# Patient Record
Sex: Female | Born: 1980 | Race: Black or African American | Hispanic: No | Marital: Single | Smoking: Current every day smoker
Health system: Southern US, Community
[De-identification: ages and names within clinical notes are randomized; demographics above are authoritative.]

## PROBLEM LIST (undated history)

## (undated) DIAGNOSIS — D332 Benign neoplasm of brain, unspecified: Secondary | ICD-10-CM

## (undated) HISTORY — PX: VENTRICULOPERITONEAL SHUNT: SHX204

---

## 2004-06-06 ENCOUNTER — Emergency Department (HOSPITAL_COMMUNITY): Admission: EM | Admit: 2004-06-06 | Discharge: 2004-06-06 | Payer: Self-pay | Admitting: *Deleted

## 2005-03-04 ENCOUNTER — Emergency Department (HOSPITAL_COMMUNITY): Admission: EM | Admit: 2005-03-04 | Discharge: 2005-03-04 | Payer: Self-pay | Admitting: Emergency Medicine

## 2005-03-20 ENCOUNTER — Emergency Department (HOSPITAL_COMMUNITY): Admission: EM | Admit: 2005-03-20 | Discharge: 2005-03-20 | Payer: Self-pay | Admitting: Emergency Medicine

## 2005-05-01 ENCOUNTER — Emergency Department (HOSPITAL_COMMUNITY): Admission: EM | Admit: 2005-05-01 | Discharge: 2005-05-01 | Payer: Self-pay | Admitting: *Deleted

## 2005-05-13 ENCOUNTER — Emergency Department (HOSPITAL_COMMUNITY): Admission: EM | Admit: 2005-05-13 | Discharge: 2005-05-13 | Payer: Self-pay | Admitting: Emergency Medicine

## 2005-05-30 ENCOUNTER — Emergency Department (HOSPITAL_COMMUNITY): Admission: EM | Admit: 2005-05-30 | Discharge: 2005-05-30 | Payer: Self-pay | Admitting: Family Medicine

## 2005-06-14 ENCOUNTER — Emergency Department (HOSPITAL_COMMUNITY): Admission: EM | Admit: 2005-06-14 | Discharge: 2005-06-15 | Payer: Self-pay | Admitting: Emergency Medicine

## 2005-06-16 ENCOUNTER — Emergency Department (HOSPITAL_COMMUNITY): Admission: EM | Admit: 2005-06-16 | Discharge: 2005-06-16 | Payer: Self-pay | Admitting: Emergency Medicine

## 2005-08-25 ENCOUNTER — Emergency Department (HOSPITAL_COMMUNITY): Admission: EM | Admit: 2005-08-25 | Discharge: 2005-08-25 | Payer: Self-pay | Admitting: Family Medicine

## 2005-08-27 ENCOUNTER — Emergency Department (HOSPITAL_COMMUNITY): Admission: EM | Admit: 2005-08-27 | Discharge: 2005-08-27 | Payer: Self-pay | Admitting: Emergency Medicine

## 2005-11-09 ENCOUNTER — Emergency Department (HOSPITAL_COMMUNITY): Admission: EM | Admit: 2005-11-09 | Discharge: 2005-11-09 | Payer: Self-pay | Admitting: Emergency Medicine

## 2005-11-20 ENCOUNTER — Emergency Department (HOSPITAL_COMMUNITY): Admission: EM | Admit: 2005-11-20 | Discharge: 2005-11-20 | Payer: Self-pay | Admitting: Family Medicine

## 2005-11-22 ENCOUNTER — Emergency Department (HOSPITAL_COMMUNITY): Admission: EM | Admit: 2005-11-22 | Discharge: 2005-11-23 | Payer: Self-pay | Admitting: Emergency Medicine

## 2005-12-08 ENCOUNTER — Emergency Department (HOSPITAL_COMMUNITY): Admission: EM | Admit: 2005-12-08 | Discharge: 2005-12-08 | Payer: Self-pay | Admitting: Emergency Medicine

## 2006-01-03 ENCOUNTER — Emergency Department (HOSPITAL_COMMUNITY): Admission: EM | Admit: 2006-01-03 | Discharge: 2006-01-03 | Payer: Self-pay | Admitting: Family Medicine

## 2006-01-06 ENCOUNTER — Emergency Department (HOSPITAL_COMMUNITY): Admission: EM | Admit: 2006-01-06 | Discharge: 2006-01-06 | Payer: Self-pay | Admitting: Family Medicine

## 2006-01-10 ENCOUNTER — Emergency Department (HOSPITAL_COMMUNITY): Admission: EM | Admit: 2006-01-10 | Discharge: 2006-01-11 | Payer: Self-pay | Admitting: Emergency Medicine

## 2006-01-20 ENCOUNTER — Emergency Department (HOSPITAL_COMMUNITY): Admission: EM | Admit: 2006-01-20 | Discharge: 2006-01-20 | Payer: Self-pay | Admitting: Emergency Medicine

## 2006-06-03 ENCOUNTER — Emergency Department (HOSPITAL_COMMUNITY): Admission: EM | Admit: 2006-06-03 | Discharge: 2006-06-03 | Payer: Self-pay | Admitting: Emergency Medicine

## 2006-06-27 ENCOUNTER — Emergency Department (HOSPITAL_COMMUNITY): Admission: AD | Admit: 2006-06-27 | Discharge: 2006-06-28 | Payer: Self-pay | Admitting: Family Medicine

## 2006-07-27 ENCOUNTER — Emergency Department (HOSPITAL_COMMUNITY): Admission: EM | Admit: 2006-07-27 | Discharge: 2006-07-27 | Payer: Self-pay | Admitting: Family Medicine

## 2006-12-28 ENCOUNTER — Emergency Department (HOSPITAL_COMMUNITY): Admission: EM | Admit: 2006-12-28 | Discharge: 2006-12-29 | Payer: Self-pay | Admitting: Emergency Medicine

## 2006-12-30 ENCOUNTER — Emergency Department (HOSPITAL_COMMUNITY): Admission: EM | Admit: 2006-12-30 | Discharge: 2006-12-30 | Payer: Self-pay | Admitting: Emergency Medicine

## 2007-02-02 ENCOUNTER — Emergency Department (HOSPITAL_COMMUNITY): Admission: EM | Admit: 2007-02-02 | Discharge: 2007-02-02 | Payer: Self-pay | Admitting: Emergency Medicine

## 2008-07-20 ENCOUNTER — Emergency Department (HOSPITAL_COMMUNITY): Admission: EM | Admit: 2008-07-20 | Discharge: 2008-07-20 | Payer: Self-pay | Admitting: Family Medicine

## 2008-10-11 ENCOUNTER — Emergency Department (HOSPITAL_COMMUNITY): Admission: EM | Admit: 2008-10-11 | Discharge: 2008-10-11 | Payer: Self-pay | Admitting: Emergency Medicine

## 2008-10-30 ENCOUNTER — Emergency Department (HOSPITAL_COMMUNITY): Admission: EM | Admit: 2008-10-30 | Discharge: 2008-10-30 | Payer: Self-pay | Admitting: Family Medicine

## 2008-12-27 ENCOUNTER — Ambulatory Visit (HOSPITAL_COMMUNITY): Admission: RE | Admit: 2008-12-27 | Discharge: 2008-12-27 | Payer: Self-pay | Admitting: Obstetrics and Gynecology

## 2009-04-07 IMAGING — CT CT NECK W/ CM
2 of 3 series · 13 of 20 positions shown, 16 images · IV contrast (omnipaque)
Comparison: 06/27/2006

CLINICAL DATA: Tonsillar abscess

NECK CT WITH CONTRAST
TECHNIQUE: Multidetector CT imaging of the neck was performed following the
standard protocol during administration of intravenous contrast.
Contrast:  100 cc Omnipaque 300

[Series 2: 2cc/(id)/1cc/(id) · axial · 0.48mm/px · z∈[-364,-158]mm · 12 of 66 slices shown, 15 images]
[im 6/66  soft-tissue]
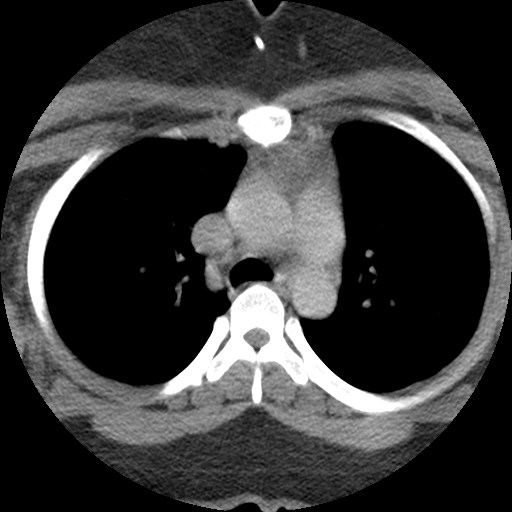
[im 6/66  bone]
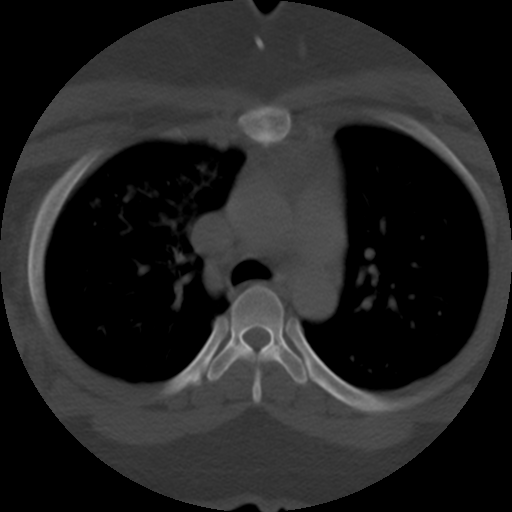
[im 11/66  bone]
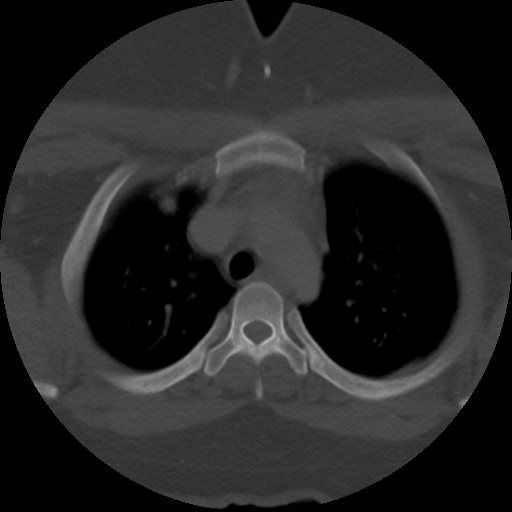
[im 16/66  bone]
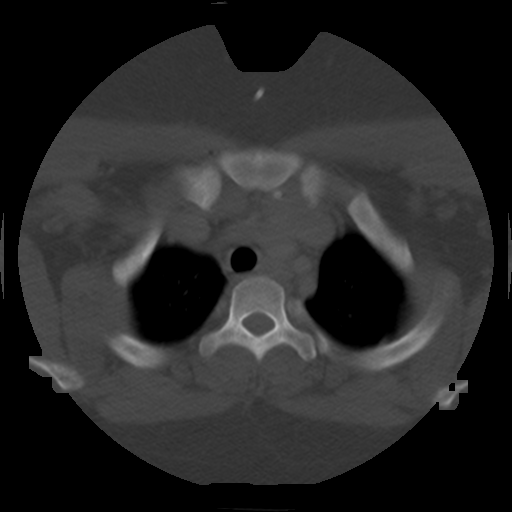
[im 21/66  bone]
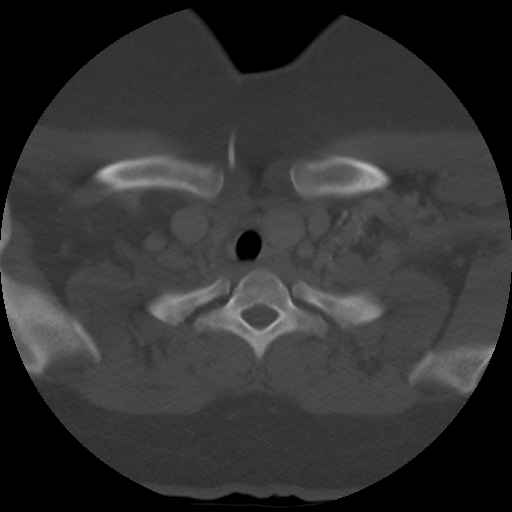
[im 26/66  soft-tissue]
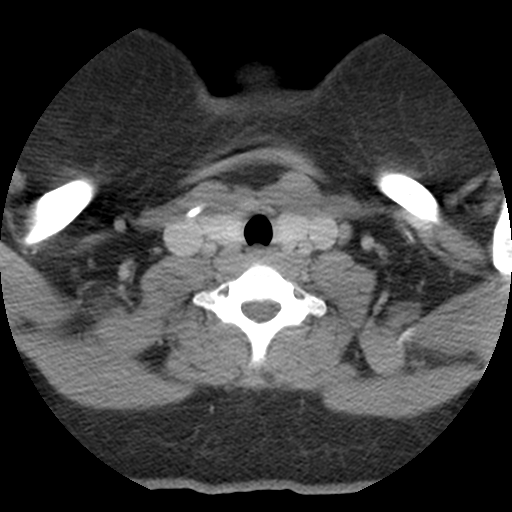
[im 26/66  bone]
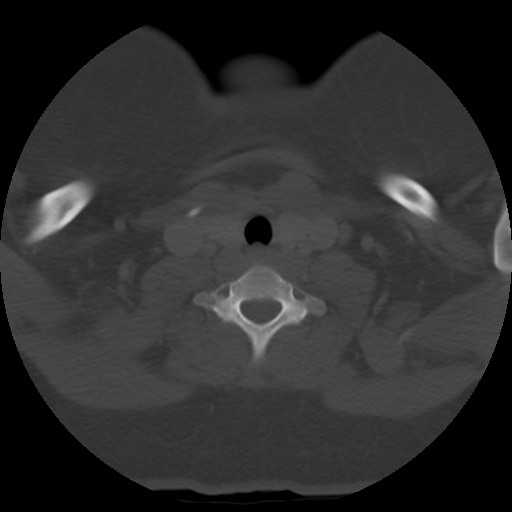
[im 31/66  bone]
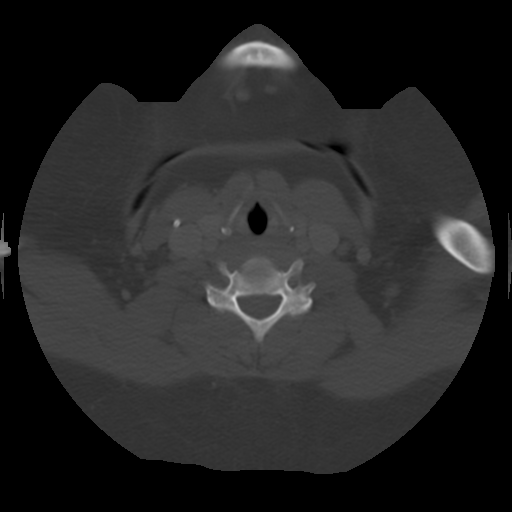
[im 36/66  bone]
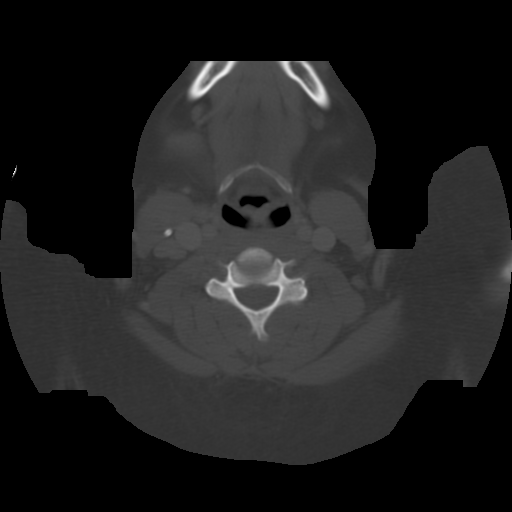
[im 41/66  bone]
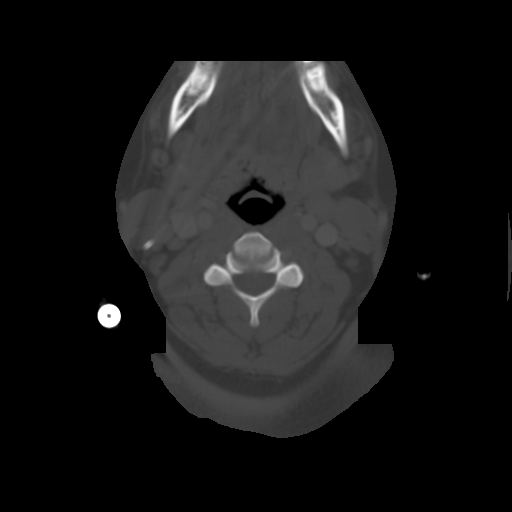
[im 46/66  soft-tissue]
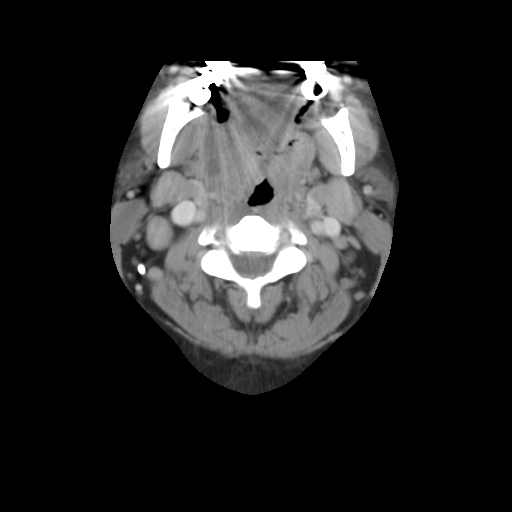
[im 46/66  bone]
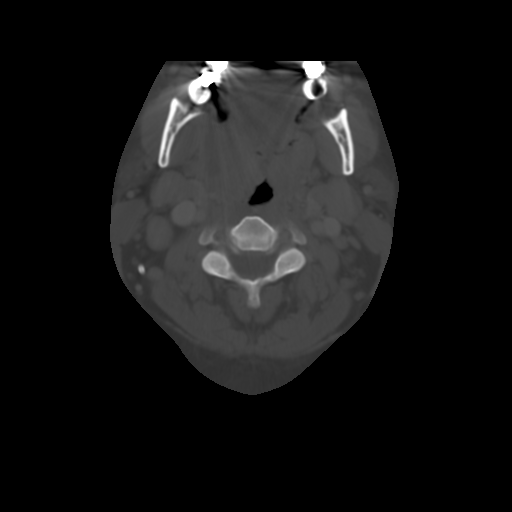
[im 51/66  bone]
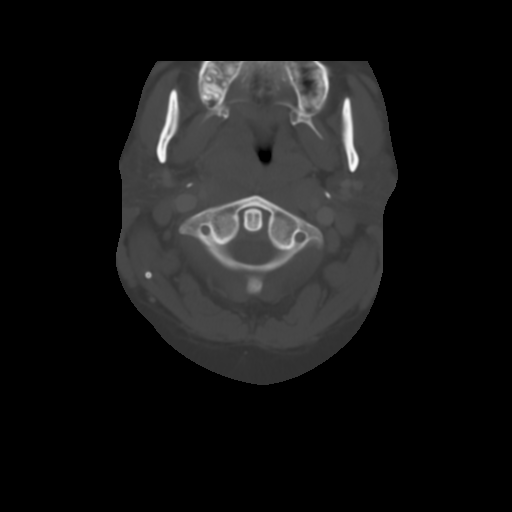
[im 56/66  bone]
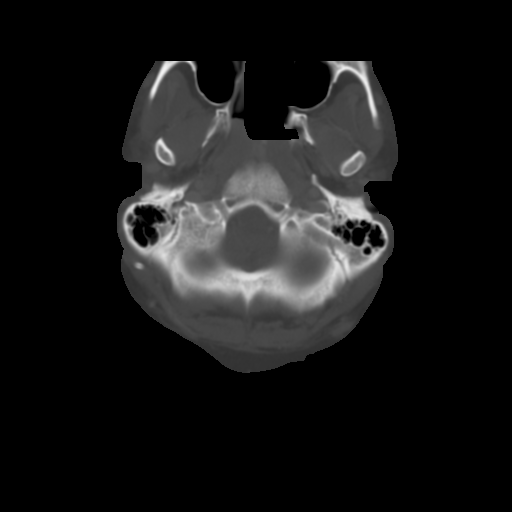
[im 61/66  bone]
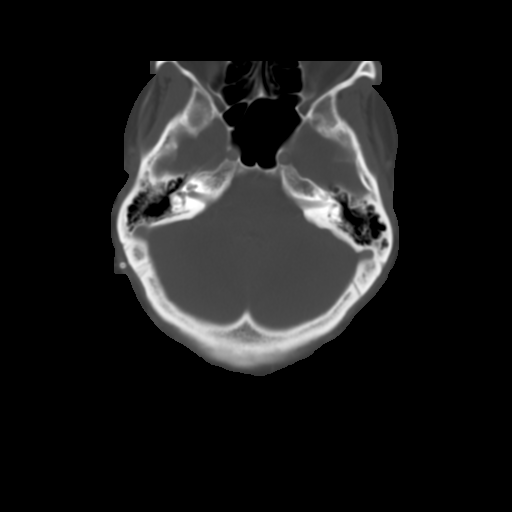

[Series 300: reformatted · sagittal · 0.49mm/px · 1 of 93 slices shown]
[im 47/93  bone]
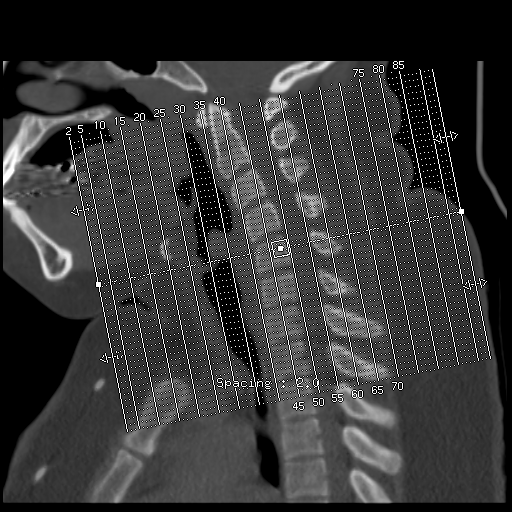

[13 of 20 positions shown; findings below may reference images not displayed]

FINDINGS: Patient has a large rim-enhancing fluid collection in the right
tonsillar region measuring 1.7 x 2.3 cm, image 19. Finding is consistent with
peritonsillar abscess. 

Multiple reactive type lymph nodes are identified. For example right
jugulodigastric lymph node measures 2.0 x 1.6cm, image 23. 

Right submandibular lymph node measures 1.8 x 1.4 cm, image 29.

No additional abnormal fluid collections are identified.

Within the right upper lobe there is air space disease which is concerning for
pneumonia.

Within the right maxillary sinus there is a retention cyst unchanged from prior
exam. 

The remaining visualized paranasal sinuses and mastoid air cells are normally
aerated. 

Review of the bone window shows no lytic or sclerotic lesions.

IMPRESSION

Large right peritonsillar abscess as above with associated lymphadenopathy in
the neck. 

Right upper lobe pneumonia

## 2009-04-20 ENCOUNTER — Inpatient Hospital Stay (HOSPITAL_COMMUNITY): Admission: AD | Admit: 2009-04-20 | Discharge: 2009-04-23 | Payer: Self-pay | Admitting: Obstetrics and Gynecology

## 2009-06-10 ENCOUNTER — Emergency Department (HOSPITAL_COMMUNITY): Admission: EM | Admit: 2009-06-10 | Discharge: 2009-06-10 | Payer: Self-pay | Admitting: Family Medicine

## 2010-03-02 ENCOUNTER — Emergency Department (HOSPITAL_COMMUNITY): Admission: EM | Admit: 2010-03-02 | Discharge: 2010-03-02 | Payer: Self-pay | Admitting: Family Medicine

## 2010-10-24 LAB — CBC
HCT: 29.5 % — ABNORMAL LOW (ref 36.0–46.0)
HCT: 33.7 % — ABNORMAL LOW (ref 36.0–46.0)
Hemoglobin: 11.1 g/dL — ABNORMAL LOW (ref 12.0–15.0)
Hemoglobin: 9.9 g/dL — ABNORMAL LOW (ref 12.0–15.0)
MCHC: 32.9 g/dL (ref 30.0–36.0)
MCHC: 33.6 g/dL (ref 30.0–36.0)
MCV: 88 fL (ref 78.0–100.0)
MCV: 88.9 fL (ref 78.0–100.0)
Platelets: 266 10*3/uL (ref 150–400)
Platelets: 324 10*3/uL (ref 150–400)
RBC: 3.33 MIL/uL — ABNORMAL LOW (ref 3.87–5.11)
RBC: 3.83 MIL/uL — ABNORMAL LOW (ref 3.87–5.11)
RDW: 13.9 % (ref 11.5–15.5)
RDW: 14.2 % (ref 11.5–15.5)
WBC: 10.5 10*3/uL (ref 4.0–10.5)
WBC: 11.2 10*3/uL — ABNORMAL HIGH (ref 4.0–10.5)

## 2010-10-24 LAB — RPR: RPR Ser Ql: NONREACTIVE

## 2010-10-31 LAB — URINALYSIS, ROUTINE W REFLEX MICROSCOPIC
Bilirubin Urine: NEGATIVE
Glucose, UA: NEGATIVE mg/dL
Hgb urine dipstick: NEGATIVE
Ketones, ur: NEGATIVE mg/dL
Nitrite: NEGATIVE
Protein, ur: NEGATIVE mg/dL
Specific Gravity, Urine: 1.025 (ref 1.005–1.030)
Urobilinogen, UA: 0.2 mg/dL (ref 0.0–1.0)
pH: 6.5 (ref 5.0–8.0)

## 2010-10-31 LAB — DIFFERENTIAL
Basophils Absolute: 0 10*3/uL (ref 0.0–0.1)
Basophils Relative: 0 % (ref 0–1)
Eosinophils Absolute: 0.1 10*3/uL (ref 0.0–0.7)
Eosinophils Relative: 1 % (ref 0–5)
Lymphocytes Relative: 17 % (ref 12–46)
Lymphs Abs: 1.4 10*3/uL (ref 0.7–4.0)
Monocytes Absolute: 0.4 10*3/uL (ref 0.1–1.0)
Monocytes Relative: 5 % (ref 3–12)
Neutro Abs: 6.4 10*3/uL (ref 1.7–7.7)
Neutrophils Relative %: 77 % (ref 43–77)

## 2010-10-31 LAB — LIPASE, BLOOD: Lipase: 16 U/L (ref 11–59)

## 2010-10-31 LAB — COMPREHENSIVE METABOLIC PANEL
ALT: 18 U/L (ref 0–35)
AST: 16 U/L (ref 0–37)
Albumin: 3.2 g/dL — ABNORMAL LOW (ref 3.5–5.2)
Alkaline Phosphatase: 65 U/L (ref 39–117)
BUN: 4 mg/dL — ABNORMAL LOW (ref 6–23)
CO2: 23 mEq/L (ref 19–32)
Calcium: 9 mg/dL (ref 8.4–10.5)
Chloride: 105 mEq/L (ref 96–112)
Creatinine, Ser: 0.44 mg/dL (ref 0.4–1.2)
GFR calc Af Amer: >60 mL/min (ref >60)
GFR calc non Af Amer: >60 mL/min (ref >60)
Glucose, Bld: 86 mg/dL (ref 70–99)
Potassium: 3.4 mEq/L — ABNORMAL LOW (ref 3.5–5.1)
Sodium: 135 mEq/L (ref 135–145)
Total Bilirubin: 0.5 mg/dL (ref 0.3–1.2)
Total Protein: 6.6 g/dL (ref 6.0–8.3)

## 2010-10-31 LAB — CBC
HCT: 35.5 % — ABNORMAL LOW (ref 36.0–46.0)
Hemoglobin: 12 g/dL (ref 12.0–15.0)
MCHC: 33.7 g/dL (ref 30.0–36.0)
MCV: 87.9 fL (ref 78.0–100.0)
Platelets: 259 10*3/uL (ref 150–400)
RBC: 4.04 MIL/uL (ref 3.87–5.11)
RDW: 13.6 % (ref 11.5–15.5)
WBC: 8.3 10*3/uL (ref 4.0–10.5)

## 2010-10-31 LAB — POCT PREGNANCY, URINE: Preg Test, Ur: POSITIVE

## 2011-04-25 LAB — POCT URINALYSIS DIP (DEVICE)
Bilirubin Urine: NEGATIVE
Glucose, UA: NEGATIVE mg/dL
Hgb urine dipstick: NEGATIVE
Ketones, ur: 15 mg/dL — AB
Nitrite: NEGATIVE
Protein, ur: NEGATIVE mg/dL
Specific Gravity, Urine: 1.02 (ref 1.005–1.030)
Urobilinogen, UA: 0.2 mg/dL (ref 0.0–1.0)
pH: 6 (ref 5.0–8.0)

## 2011-04-25 LAB — POCT PREGNANCY, URINE: Preg Test, Ur: NEGATIVE

## 2011-05-05 LAB — POCT PREGNANCY, URINE
Operator id: 282151
Preg Test, Ur: NEGATIVE

## 2011-05-08 LAB — CBC
HCT: 38.3
Hemoglobin: 12.7
MCHC: 33.1
MCV: 84
Platelets: 320
RBC: 4.56
RDW: 13.4
WBC: 12 — ABNORMAL HIGH

## 2011-05-08 LAB — BASIC METABOLIC PANEL
BUN: 7
CO2: 24
Calcium: 8.8
Chloride: 103
Creatinine, Ser: 0.67
GFR calc Af Amer: >60
GFR calc non Af Amer: >60
Glucose, Bld: 94
Potassium: 3.8
Sodium: 135

## 2011-05-08 LAB — DIFFERENTIAL
Basophils Absolute: 0
Basophils Relative: 0
Eosinophils Absolute: 0.1
Eosinophils Relative: 1
Lymphocytes Relative: 19
Lymphs Abs: 2.3
Monocytes Absolute: 1 — ABNORMAL HIGH
Monocytes Relative: 9
Neutro Abs: 8.6 — ABNORMAL HIGH
Neutrophils Relative %: 71

## 2011-05-08 LAB — RAPID STREP SCREEN (MED CTR MEBANE ONLY): Streptococcus, Group A Screen (Direct): POSITIVE — AB

## 2011-09-22 ENCOUNTER — Encounter (HOSPITAL_COMMUNITY): Payer: Self-pay | Admitting: *Deleted

## 2011-09-22 ENCOUNTER — Emergency Department (HOSPITAL_COMMUNITY)
Admission: EM | Admit: 2011-09-22 | Discharge: 2011-09-22 | Disposition: A | Discharge status: Home / Self Care | Payer: Medicaid Other | Attending: Emergency Medicine | Admitting: Emergency Medicine

## 2011-09-22 ENCOUNTER — Other Ambulatory Visit: Payer: Self-pay

## 2011-09-22 ENCOUNTER — Emergency Department (HOSPITAL_COMMUNITY): Payer: Medicaid Other

## 2011-09-22 DIAGNOSIS — R0789 Other chest pain: Secondary | ICD-10-CM

## 2011-09-22 DIAGNOSIS — R071 Chest pain on breathing: Secondary | ICD-10-CM | POA: Insufficient documentation

## 2011-09-22 DIAGNOSIS — R079 Chest pain, unspecified: Secondary | ICD-10-CM | POA: Insufficient documentation

## 2011-09-22 DIAGNOSIS — F17200 Nicotine dependence, unspecified, uncomplicated: Secondary | ICD-10-CM | POA: Insufficient documentation

## 2011-09-22 HISTORY — DX: Benign neoplasm of brain, unspecified: D33.2

## 2011-09-22 LAB — BASIC METABOLIC PANEL
BUN: 7 mg/dL (ref 6–23)
Calcium: 9.2 mg/dL (ref 8.4–10.5)
Chloride: 105 mEq/L (ref 96–112)
Creatinine, Ser: 0.53 mg/dL (ref 0.50–1.10)
GFR calc Af Amer: >90 mL/min (ref >90)
GFR calc non Af Amer: >90 mL/min (ref >90)

## 2011-09-22 LAB — CBC
HCT: 40.2 % (ref 36.0–46.0)
MCH: 28.3 pg (ref 26.0–34.0)
MCHC: 32.8 g/dL (ref 30.0–36.0)
MCV: 86.3 fL (ref 78.0–100.0)
Platelets: 273 10*3/uL (ref 150–400)
RDW: 13.6 % (ref 11.5–15.5)

## 2011-09-22 MED ORDER — ONDANSETRON HCL 4 MG/2ML IJ SOLN
4.0000 mg | Freq: Once | INTRAMUSCULAR | Status: AC
  Administered 2011-09-22: 4 mg via INTRAVENOUS
  Filled 2011-09-22: qty 2

## 2011-09-22 MED ORDER — ASPIRIN 81 MG PO CHEW
324.0000 mg | CHEWABLE_TABLET | Freq: Once | ORAL | Status: AC
  Administered 2011-09-22: 324 mg via ORAL
  Filled 2011-09-22: qty 4

## 2011-09-22 MED ORDER — HYDROCODONE-ACETAMINOPHEN 5-325 MG PO TABS: 1.0000 | ORAL_TABLET | ORAL | Status: AC | PRN

## 2011-09-22 MED ORDER — OXYCODONE-ACETAMINOPHEN 5-325 MG PO TABS
2.0000 | ORAL_TABLET | Freq: Once | ORAL | Status: AC
  Administered 2011-09-22: 2 via ORAL
  Filled 2011-09-22: qty 2

## 2011-09-22 MED ORDER — MORPHINE SULFATE 4 MG/ML IJ SOLN
4.0000 mg | Freq: Once | INTRAMUSCULAR | Status: AC
  Administered 2011-09-22: 4 mg via INTRAVENOUS
  Filled 2011-09-22: qty 1

## 2011-09-22 NOTE — Discharge Instructions (Signed)
Chest Wall Pain Chest wall pain is pain in or around the bones and muscles of your chest. It may take up to 6 weeks to get better. It may take longer if you must stay physically active in your work and activities.  CAUSES  Chest wall pain may happen on its own. However, it may be caused by:  A viral illness like the flu.   Injury.   Coughing.   Exercise.   Arthritis.   Fibromyalgia.   Shingles.  HOME CARE INSTRUCTIONS   Avoid overtiring physical activity. Try not to strain or perform activities that cause pain. This includes any activities using your chest or your abdominal and side muscles, especially if heavy weights are used.   Put ice on the sore area.   Put ice in a plastic bag.   Place a towel between your skin and the bag.   Leave the ice on for 15 to 20 minutes per hour while awake for the first 2 days.   Only take over-the-counter or prescription medicines for pain, discomfort, or fever as directed by your caregiver.  SEEK IMMEDIATE MEDICAL CARE IF:   Your pain increases, or you are very uncomfortable.   You have a fever.   Your chest pain becomes worse.   You have new, unexplained symptoms.   You have nausea or vomiting.   You feel sweaty or lightheaded.   You have a cough with phlegm (sputum), or you cough up blood.  MAKE SURE YOU:   Understand these instructions.   Will watch your condition.   Will get help right away if you are not doing well or get worse.  Document Released: 07/07/2005 Document Revised: 06/26/2011 Document Reviewed: 03/03/2011 [employer] Patient Information 2012 [employer]. 

## 2011-09-22 NOTE — ED Provider Notes (Signed)
Medical screening examination/treatment/procedure(s) were conducted as a shared visit with non-physician practitioner(s) and myself.  I personally evaluated the patient during the encounter  Chanice Brenton, MD 09/22/11 1735 

## 2011-09-22 NOTE — ED Notes (Signed)
Patient with complaints of mid chest pain for 3 days,  She denies any sob,  Denies n/v. Denies cough.  Patient states taking a deep breath increases her pain.  She is on birth control

## 2011-09-22 NOTE — ED Notes (Signed)
Transported to xray 

## 2011-09-22 NOTE — ED Provider Notes (Signed)
History     CSN: [id number]  Arrival date & time 09/22/11  4098   First MD Initiated Contact with Patient 09/22/11 0957      Chief Complaint  Patient presents with  . Chest Pain    (Consider location/radiation/quality/duration/timing/severity/associated sxs/prior treatment) HPI  Presents to the emergency department with complaints of chest pain that started 3 days ago. The pain starts in her left shoulder and radiates down across her chest. The patient denies having any shortness of breath, nausea, vomiting, difficulty breathing. \Patient has a Mirena IUD. Patient denies family history of cardiac disease. Patient has not traveled recently. Patient does not have any lower extremity swelling, history of leg swelling or clots in legs or chest. The patient is a smoker. The patient states that when she moves a certain way her pain is elicited and then calms down. Denies pain worsening with palpation.  Past Medical History  Diagnosis Date  . Benign cerebral tumor     Past Surgical History  Procedure Date  . Ventriculoperitoneal shunt     No family history on file.  History  Substance Use Topics  . Smoking status: Current Everyday Smoker  . Smokeless tobacco: Not on file  . Alcohol Use: Yes    OB History    Grav Para Term Preterm Abortions TAB SAB Ect Mult Living                  Review of Systems  All other systems reviewed and are negative.    Allergies  Review of patient's allergies indicates no known allergies.  Home Medications   Current Outpatient Rx  Name Route Sig Dispense Refill  . IBUPROFEN 200 MG PO TABS Oral Take 400 mg by mouth every 6 (six) hours as needed. For pain    . HYDROCODONE-ACETAMINOPHEN 5-325 MG PO TABS Oral Take 1 tablet by mouth every 4 (four) hours as needed for pain. 8 tablet 0    BP 112/73  Pulse 96  Temp(Src) 97.6 F (36.4 C) (Oral)  Resp 23  Ht 5\' 5"  (1.651 m)  Wt 274 lb (124.286 kg)  BMI 45.60 kg/m2  SpO2 100%  LMP  09/02/2011  Physical Exam  Nursing note and vitals reviewed. Constitutional: She appears well-developed and well-nourished. No distress.  HENT:  Head: Normocephalic and atraumatic.  Eyes: Pupils are equal, round, and reactive to light.  Neck: Normal range of motion. Neck supple.  Cardiovascular: Normal rate and regular rhythm.   Pulmonary/Chest: Effort normal.  Abdominal: Soft.  Musculoskeletal:       Left shoulder: She exhibits pain and spasm. She exhibits normal range of motion, no tenderness, no bony tenderness, no swelling, no effusion, no crepitus, no deformity, no laceration and normal pulse.  Neurological: She is alert.  Skin: Skin is warm and dry.    ED Course  Procedures (including critical care time)  Labs Reviewed  BASIC METABOLIC PANEL - Abnormal; Notable for the following:    Glucose, Bld 100 (*)    All other components within normal limits  CBC  TROPONIN I   Dg Chest 2 View  09/22/2011  *RADIOLOGY REPORT*  Clinical Data: Chest pain.  CHEST - 2 VIEW  Comparison: 06/03/2006.  Findings: Stable normal sized heart and clear lungs with normal vascularity.  Stable right ventricular peritoneal shunt catheter. Unremarkable bones.  IMPRESSION: No acute abnormality.  Original Report Authenticated By: Darrol Angel, M.D.     1. Chest wall pain       MDM  PT is PERC negative and has no risk factors on the WELLS criteria.  Pt very low risk for Pulmonary.   Date: 09/22/2011  Rate: 85  Rhythm: normal sinus rhythm  QRS Axis: normal  Intervals: normal  ST/T Wave abnormalities: normal  Conduction Disutrbances:none  Narrative Interpretation:   Old EKG Reviewed: unchanged from Jun 03, 2006  Pt evaluated by Dr. Ethelda Chick and myself - her smoking and diet/sugars were discussed with her and she ahs been informed on the dangers. Her cardiac work-up is negative. Pt given pain mediations for muscular pain. Pt also given a referral to Dr. Ranell Patrick with Ortho for follow-up if pain  persists.  Dorthula Matas, PA 09/22/11 1302

## 2011-09-22 NOTE — ED Provider Notes (Signed)
Complaint of anterior chest pain left sided onset 3 days ago constant worse with moving or twisting her torso no shortness of breath no nausea no sweatiness pain is nonexertional. Birth control consists of IUD. On exam alert nontoxic lungs clear to auscultation heart regular rate and rhythm chest is exquisitely tender left-sided anterior reproducing pain exactly. Medical decision making doubt cardiac etiology with highly atypical symptoms near normal ECG. Only risk factor smoking. Doubt pulmonary embolism denies shortness of breath atypical symptoms. Symptoms most consistent with musculoskeletal chest pain  Doug Sou, MD 09/22/11 1243

## 2012-03-13 ENCOUNTER — Emergency Department (HOSPITAL_COMMUNITY)
Admission: EM | Admit: 2012-03-13 | Discharge: 2012-03-13 | Disposition: A | Discharge status: Home / Self Care | Payer: Self-pay | Attending: Emergency Medicine | Admitting: Emergency Medicine

## 2012-03-13 ENCOUNTER — Encounter (HOSPITAL_COMMUNITY): Payer: Self-pay | Admitting: Family Medicine

## 2012-03-13 DIAGNOSIS — F17200 Nicotine dependence, unspecified, uncomplicated: Secondary | ICD-10-CM | POA: Insufficient documentation

## 2012-03-13 DIAGNOSIS — N63 Unspecified lump in unspecified breast: Secondary | ICD-10-CM | POA: Insufficient documentation

## 2012-03-13 DIAGNOSIS — N644 Mastodynia: Secondary | ICD-10-CM | POA: Insufficient documentation

## 2012-03-13 MED ORDER — IBUPROFEN 400 MG PO TABS
800.0000 mg | ORAL_TABLET | Freq: Once | ORAL | Status: AC
Start: 2012-03-13 — End: 2012-03-13
  Administered 2012-03-13: 800 mg via ORAL

## 2012-03-13 MED ORDER — IBUPROFEN 400 MG PO TABS
ORAL_TABLET | ORAL | Status: AC
  Filled 2012-03-13: qty 2

## 2012-03-13 NOTE — ED Notes (Signed)
Per pt sts right breast knot and pain since yesterday . denies drainage.

## 2012-03-13 NOTE — ED Provider Notes (Signed)
History  This chart was scribed for Richardean Canal, MD by Erskine Emery. This patient was seen in room TR07C/TR07C and the patient's care was started at 15:55.   CSN: [id number]  Arrival date & time 03/13/12  1428   None     Chief Complaint  Patient presents with  . Breast Pain    (Consider location/radiation/quality/duration/timing/severity/associated sxs/prior treatment) The history is provided by the patient. No language interpreter was used.  Tracy Harrington is a 31 y.o. female who presents to the Emergency Department complaining of a knot in the right breast, first noticed 2 days ago. Pt denies any associated vaginal discharge or fevers.  Her LNMP was the 18th of this month (about a week ago) and she denies any chance of pregnancy. Pt has never experienced similar symptoms and has never had a mammogram. Pt has a h/o a benign cerebral tumor but no other medical issues and no family history of breast cancer.   Past Medical History  Diagnosis Date  . Benign cerebral tumor     Past Surgical History  Procedure Date  . Ventriculoperitoneal shunt     History reviewed. No pertinent family history.  History  Substance Use Topics  . Smoking status: Current Everyday Smoker  . Smokeless tobacco: Not on file  . Alcohol Use: Yes    OB History    Grav Para Term Preterm Abortions TAB SAB Ect Mult Living                  Review of Systems  Constitutional: Negative for fever and chills.  Respiratory: Negative for shortness of breath.   Gastrointestinal: Negative for nausea and vomiting.  Genitourinary: Negative for vaginal discharge.  Musculoskeletal:       Right breast knot.  Neurological: Negative for weakness.    Allergies  Review of patient's allergies indicates no known allergies.  Home Medications   Current Outpatient Rx  Name Route Sig Dispense Refill  . IBUPROFEN 200 MG PO TABS Oral Take 400 mg by mouth every 6 (six) hours as needed. For pain      BP 115/64   Pulse 82  Temp 98.6 F (37 C) (Oral)  Resp 20  SpO2 98%  LMP 03/07/2012  Physical Exam  Nursing note and vitals reviewed. Constitutional: She is oriented to person, place, and time. She appears well-developed and well-nourished. No distress.  HENT:  Head: Normocephalic and atraumatic.  Eyes: EOM are normal.  Neck: Neck supple. No tracheal deviation present.  Cardiovascular: Normal rate.   Pulmonary/Chest: Effort normal. No respiratory distress. Right breast exhibits tenderness. Right breast exhibits no nipple discharge. Left breast exhibits no nipple discharge.       Right breast is lumpy with some tenderness around the nipple. Nipple is firm. No breast discharge bilaterally.  Musculoskeletal: Normal range of motion.  Neurological: She is alert and oriented to person, place, and time.  Skin: Skin is warm and dry.  Psychiatric: She has a normal mood and affect. Her behavior is normal.    ED Course  Procedures (including critical care time) DIAGNOSTIC STUDIES: Oxygen Saturation is 98% on room air, normal by my interpretation.    COORDINATION OF CARE: 15:55--I evaluated the patient and we discussed a treatment plan including breast examination to which the pt agreed.   16:00--I performed the breast examination with a chaperone present.    Labs Reviewed - No data to display No results found.   1. Breast pain  MDM  Tracy Harrington is a 31 y.o. female with tenderness L nipple. I counseled patient to get a mammogram to r/o underlying mass. She is to take motrin for pain in the mean time.    This document was completed by the scribe at my direction and I have reviewed its accuracy. I have personally examined the patient and agrees with the above document.   Chaney Malling, MD     Richardean Canal, MD 03/13/12 [id number]

## 2012-06-07 ENCOUNTER — Emergency Department (HOSPITAL_COMMUNITY)
Admission: EM | Admit: 2012-06-07 | Discharge: 2012-06-08 | Disposition: A | Discharge status: Home / Self Care | Payer: Self-pay | Attending: Emergency Medicine | Admitting: Emergency Medicine

## 2012-06-07 ENCOUNTER — Encounter (HOSPITAL_COMMUNITY): Payer: Self-pay | Admitting: *Deleted

## 2012-06-07 DIAGNOSIS — Z86011 Personal history of benign neoplasm of the brain: Secondary | ICD-10-CM | POA: Insufficient documentation

## 2012-06-07 DIAGNOSIS — M545 Low back pain, unspecified: Secondary | ICD-10-CM | POA: Insufficient documentation

## 2012-06-07 DIAGNOSIS — Z79899 Other long term (current) drug therapy: Secondary | ICD-10-CM | POA: Insufficient documentation

## 2012-06-07 DIAGNOSIS — F17200 Nicotine dependence, unspecified, uncomplicated: Secondary | ICD-10-CM | POA: Insufficient documentation

## 2012-06-07 NOTE — ED Provider Notes (Signed)
History   This chart was scribed for Tracy Racer, MD by Gerlean Ren, ED Scribe. This patient was seen in room TR05C/TR05C and the patient's care was started at 11:52 PM    CSN: [id number]  Arrival date & time 06/07/12  2259   First MD Initiated Contact with Patient 06/07/12 2328      Chief Complaint  Patient presents with  . Back Pain     The history is provided by the patient. No language interpreter was used.   Tracy Harrington is a 31 y.o. female who presents to the Emergency Department complaining of lower back pain that suddenly worsened today with no particular movement or position that is worsened by movement and has no improvement with tylenol.  Pt denies urinary symptoms, abdominal pain, and numbness or tingling in lower extremities.  Pt states she has h/o similar lower back pain but that current pain is more severe.  Pt is a current everyday smoker and reports alcohol use.   Past Medical History  Diagnosis Date  . Benign cerebral tumor     Past Surgical History  Procedure Date  . Ventriculoperitoneal shunt     History reviewed. No pertinent family history.  History  Substance Use Topics  . Smoking status: Current Every Day Smoker  . Smokeless tobacco: Not on file  . Alcohol Use: Yes    No OB history provided.   Review of Systems  Musculoskeletal: Positive for back pain. Negative for gait problem.  Neurological: Negative for numbness.    Allergies  Review of patient's allergies indicates no known allergies.  Home Medications   Current Outpatient Rx  Name  Route  Sig  Dispense  Refill  . ACETAMINOPHEN 500 MG PO TABS   Oral   Take 500 mg by mouth every 6 (six) hours as needed. pain         . ASPIRIN-ACETAMINOPHEN-CAFFEINE 250-250-65 MG PO TABS   Oral   Take 1 tablet by mouth every 6 (six) hours as needed. pain         . IBUPROFEN 600 MG PO TABS   Oral   Take 1 tablet (600 mg total) by mouth every 6 (six) hours as needed for pain.   30  tablet   0   . METHOCARBAMOL 500 MG PO TABS   Oral   Take 1 tablet (500 mg total) by mouth 2 (two) times daily.   20 tablet   0   . TRAMADOL HCL 50 MG PO TABS   Oral   Take 1 tablet (50 mg total) by mouth every 6 (six) hours as needed for pain.   15 tablet   0     BP 101/66  Pulse 75  Temp 98.7 F (37.1 C) (Oral)  Resp 16  SpO2 98%  Physical Exam  Nursing note and vitals reviewed. Constitutional: She is oriented to person, place, and time. She appears well-developed and well-nourished.       Pt is morbidly obese.  HENT:  Head: Normocephalic and atraumatic.  Eyes: Conjunctivae normal and EOM are normal. Pupils are equal, round, and reactive to light.  Neck: Normal range of motion. Neck supple.  Cardiovascular: Normal rate, regular rhythm and normal heart sounds.   Pulmonary/Chest: Effort normal and breath sounds normal.  Abdominal: Soft. Bowel sounds are normal.  Musculoskeletal: Normal range of motion.       Diffuse lumbar tenderness.  Ambulatory.    Neurological: She is alert and oriented to person, place, and  time.  Skin: Skin is warm and dry.  Psychiatric: She has a normal mood and affect.    ED Course  Procedures (including critical care time) DIAGNOSTIC STUDIES: Oxygen Saturation is 98% on room air, normal by my interpretation.    COORDINATION OF CARE: 11:56 PM- Patient informed of clinical course, understands medical decision-making process, and agrees with plan.  Ordered urinalysis and pregnancy test.      Labs Reviewed  URINALYSIS, ROUTINE W REFLEX MICROSCOPIC - Abnormal; Notable for the following:    APPearance HAZY (*)     Specific Gravity, Urine 1.043 (*)     All other components within normal limits  PREGNANCY, URINE   No results found.   1. Low back pain       MDM  I personally performed the services described in this documentation, which was scribed in my presence. The recorded information has been reviewed and is accurate.   No  red flags on exam or history. Will treat symptomatically and advise weight loss    Tracy Racer, MD 06/08/12 0040

## 2012-06-07 NOTE — ED Notes (Addendum)
C/o low back pain, onset 1400, 8/10, worse with movement and certain positions, also mild abd cramping and mild dizziness, (denies: radiation, nvd, fever, bleeding, urinary or vaginal sx), tried excedrin and tylenol PTA, no relief. "H/o similar back pain, but not this bad". Walks with limp, pain aggravated by movement & cough/sneeze.

## 2012-06-08 LAB — URINALYSIS, ROUTINE W REFLEX MICROSCOPIC
Bilirubin Urine: NEGATIVE
Hgb urine dipstick: NEGATIVE
Ketones, ur: NEGATIVE mg/dL
Specific Gravity, Urine: 1.043 — ABNORMAL HIGH (ref 1.005–1.030)
Urobilinogen, UA: 0.2 mg/dL (ref 0.0–1.0)

## 2012-06-08 MED ORDER — METHOCARBAMOL 500 MG PO TABS
1000.0000 mg | ORAL_TABLET | Freq: Once | ORAL | Status: AC
  Administered 2012-06-08: 1000 mg via ORAL
  Filled 2012-06-08: qty 2

## 2012-06-08 MED ORDER — METHOCARBAMOL 500 MG PO TABS: 500.0000 mg | ORAL_TABLET | Freq: Two times a day (BID) | ORAL | Status: DC

## 2012-06-08 MED ORDER — TRAMADOL HCL 50 MG PO TABS: 50.0000 mg | ORAL_TABLET | Freq: Four times a day (QID) | ORAL | Status: DC | PRN

## 2012-06-08 MED ORDER — TRAMADOL HCL 50 MG PO TABS
50.0000 mg | ORAL_TABLET | Freq: Once | ORAL | Status: AC
  Administered 2012-06-08: 50 mg via ORAL
  Filled 2012-06-08: qty 1

## 2012-06-08 MED ORDER — IBUPROFEN 600 MG PO TABS: 600.0000 mg | ORAL_TABLET | Freq: Four times a day (QID) | ORAL | Status: DC | PRN

## 2012-06-08 MED ORDER — KETOROLAC TROMETHAMINE 60 MG/2ML IM SOLN
60.0000 mg | Freq: Once | INTRAMUSCULAR | Status: AC
  Administered 2012-06-08: 60 mg via INTRAMUSCULAR
  Filled 2012-06-08: qty 2

## 2013-05-01 ENCOUNTER — Emergency Department (HOSPITAL_COMMUNITY)
Admission: EM | Admit: 2013-05-01 | Discharge: 2013-05-01 | Disposition: A | Discharge status: Home / Self Care | Payer: Medicaid Other | Attending: Emergency Medicine | Admitting: Emergency Medicine

## 2013-05-01 ENCOUNTER — Encounter (HOSPITAL_COMMUNITY): Payer: Self-pay | Admitting: Emergency Medicine

## 2013-05-01 ENCOUNTER — Emergency Department (HOSPITAL_COMMUNITY): Payer: Medicaid Other

## 2013-05-01 DIAGNOSIS — N6310 Unspecified lump in the right breast, unspecified quadrant: Secondary | ICD-10-CM

## 2013-05-01 DIAGNOSIS — Z8669 Personal history of other diseases of the nervous system and sense organs: Secondary | ICD-10-CM | POA: Insufficient documentation

## 2013-05-01 DIAGNOSIS — F17200 Nicotine dependence, unspecified, uncomplicated: Secondary | ICD-10-CM | POA: Insufficient documentation

## 2013-05-01 DIAGNOSIS — G8929 Other chronic pain: Secondary | ICD-10-CM | POA: Insufficient documentation

## 2013-05-01 DIAGNOSIS — N63 Unspecified lump in unspecified breast: Secondary | ICD-10-CM | POA: Insufficient documentation

## 2013-05-01 DIAGNOSIS — Z3202 Encounter for pregnancy test, result negative: Secondary | ICD-10-CM | POA: Insufficient documentation

## 2013-05-01 DIAGNOSIS — M545 Low back pain, unspecified: Secondary | ICD-10-CM | POA: Insufficient documentation

## 2013-05-01 DIAGNOSIS — R209 Unspecified disturbances of skin sensation: Secondary | ICD-10-CM | POA: Insufficient documentation

## 2013-05-01 LAB — PREGNANCY, URINE: Preg Test, Ur: NEGATIVE

## 2013-05-01 LAB — URINE MICROSCOPIC-ADD ON

## 2013-05-01 LAB — URINALYSIS, ROUTINE W REFLEX MICROSCOPIC
Glucose, UA: NEGATIVE mg/dL
Ketones, ur: NEGATIVE mg/dL
pH: 6.5 (ref 5.0–8.0)

## 2013-05-01 MED ORDER — CEPHALEXIN 500 MG PO CAPS: 500.0000 mg | ORAL_CAPSULE | Freq: Four times a day (QID) | ORAL | Status: AC

## 2013-05-01 MED ORDER — CYCLOBENZAPRINE HCL 5 MG PO TABS: 5.0000 mg | ORAL_TABLET | Freq: Three times a day (TID) | ORAL | Status: DC | PRN

## 2013-05-01 MED ORDER — NAPROXEN 500 MG PO TABS: 500.0000 mg | ORAL_TABLET | Freq: Two times a day (BID) | ORAL | Status: DC

## 2013-05-01 NOTE — ED Provider Notes (Signed)
CSN: [id number]     Arrival date & time 05/01/13  1523 History   First MD Initiated Contact with Patient 05/01/13 1540     Chief Complaint  Patient presents with  . Cyst  . Back Pain    HPI  Tracy Harrington is a 32 y.o. female with a PMH of benign cerebral tumor s/p VP shunt who presents to the ED for evaluation of cyst and back pain.  History was provided by the patient.    Patient states that she has had a cyst on her breast for the past week. She states that this cyst appears to be growing larger and is very painful to palpation. The cysts is located on the areola of her right nipple. She states that she had a similar cyst a few months ago and was prescribed some antibiotics in the cyst resolved. She states that this cyst is similar to her previous cyst and is in the same location. She states that she did brush her right breast up against a door however denies any wounds or trauma. She is not currently breast-feeding. She states that she's had some intermittent green and yellow discharge from her right nipple, however has had no continuous drainage. She is currently on her menstrual period. She does not currently have an OB/GYN or primary care provider. Patient states that she has "lumpy breasts" however has not had a mammogram in the past. She was told that she likely needed a mammogram however has not done so.  Patient also complains of some lower back pain for the past 2 years. Patient states that her pain began after delivery of her 77-year-old daughter. She denies any acute injuries or trauma. She states that her pain is been getting progressively worse over the past 2 months. She states that her pain is located equally in the right and left lower lumbar region with radiation down the back of her legs bilaterally to her knees.  No knee pain. Her pain is intermittent. Her pain is worse with movement, walking, and standing for long periods of time. Did not take anything for pain today yet. She  states that she developed numbness and tingling in her feet a few months ago, however, she denies this currently. She denies any difficulty urinating, hematuria, vaginal discharge, flank pain, abdominal pain, nausea, emesis, diarrhea, constipation, weakness, loss of sensation, or loss of bowel/bladder function.  She denies any trauma or injuries to her back.  She states she has had x-rays in the past but is has been "awhile."  She otherwise has been well with no recent fevers, chills, change in appetite/activity, rhinorrhea, chest pain, SOB, headache, dizziness or lightheadedness.     Past Medical History  Diagnosis Date  . Benign cerebral tumor    Past Surgical History  Procedure Laterality Date  . Ventriculoperitoneal shunt     No family history on file. History  Substance Use Topics  . Smoking status: Current Every Day Smoker  . Smokeless tobacco: Not on file  . Alcohol Use: Yes   OB History   Grav Para Term Preterm Abortions TAB SAB Ect Mult Living                 Review of Systems  Constitutional: Negative for fever, chills, activity change, appetite change and fatigue.  HENT: Negative for congestion, rhinorrhea and sore throat.   Eyes: Negative for photophobia and visual disturbance.  Respiratory: Negative for cough, shortness of breath and wheezing.   Cardiovascular: Negative  for chest pain and leg swelling.  Gastrointestinal: Negative for nausea, vomiting, abdominal pain, diarrhea, constipation and abdominal distention.  Genitourinary: Negative for dysuria, hematuria, decreased urine volume, vaginal bleeding, vaginal discharge, genital sores, vaginal pain and pelvic pain.  Musculoskeletal: Positive for back pain. Negative for arthralgias, gait problem, joint swelling, myalgias and neck stiffness.  Skin: Positive for color change. Negative for wound.  Neurological: Positive for numbness. Negative for dizziness, syncope, weakness, light-headedness and headaches.   Psychiatric/Behavioral: Negative for confusion.    Allergies  Review of patient's allergies indicates no known allergies.  Home Medications   Current Outpatient Rx  Name  Route  Sig  Dispense  Refill  . levonorgestrel (MIRENA) 20 MCG/24HR IUD   Intrauterine   1 each by Intrauterine route once.          BP 129/84  Pulse 85  Temp(Src) 98 F (36.7 C) (Oral)  Resp 16  SpO2 98%  LMP 05/01/2013  Filed Vitals:   05/01/13 1555  BP: 129/84  Pulse: 85  Temp: 98 F (36.7 C)  TempSrc: Oral  Resp: 16  SpO2: 98%    Physical Exam  Nursing note and vitals reviewed. Constitutional: She is oriented to person, place, and time. She appears well-developed and well-nourished. No distress.  HENT:  Head: Normocephalic and atraumatic.  Right Ear: External ear normal.  Left Ear: External ear normal.  Nose: Nose normal.  Mouth/Throat: Oropharynx is clear and moist.  Eyes: Conjunctivae are normal. Right eye exhibits no discharge. Left eye exhibits no discharge.  Neck: Normal range of motion. Neck supple.  Cardiovascular: Normal rate, regular rhythm, normal heart sounds and intact distal pulses.  Exam reveals no gallop and no friction rub.   No murmur heard. Pulmonary/Chest: Effort normal and breath sounds normal. No respiratory distress. She has no wheezes. She has no rales. She exhibits tenderness.    1 cm x 1 cm circular mass with tenderness to palpation to the areola of the right breast at the 9:00 to 12:00 position.  Area is indurated and firm without fluctuance. Mild erythema overlying the mass with no open wounds.  No evidence of nipple discharge or tenderness/edema.    Abdominal: Soft. She exhibits no distension. There is no tenderness.  Musculoskeletal: Normal range of motion. She exhibits tenderness. She exhibits no edema.  Tenderness to palpation to the right and left paraspinal lumbar region diffusely. No thoracic or lumbar spinal tenderness to palpation. Negative straight leg  raise bilaterally. Patient able to flex and extend hips without difficulty or limitations. Patient able to flex and extend knees without difficulty or limitations. Patient able to ambulate without difficulty or ataxia. No overlying wounds, edema, erythema, or ecchymosis to the lower lumbar region diffusely  Neurological: She is alert and oriented to person, place, and time.  Patellar reflexes intact bilaterally. Gross sensation intact in the lower extremities bilaterally.  Skin: Skin is warm and dry. She is not diaphoretic.    ED Course  Procedures (including critical care time) Labs Review Labs Reviewed  URINALYSIS, ROUTINE W REFLEX MICROSCOPIC  PREGNANCY, URINE   Imaging Review No results found.  EKG Interpretation   None      Results for orders placed during the hospital encounter of 05/01/13  URINE CULTURE      Result Value Range   Specimen Description URINE, CLEAN CATCH     Special Requests NONE     Culture  Setup Time       Value: 05/02/2013 03:37  Performed at [facility] Count       Value: 25,000 COLONIES/ML     Performed at [facility]   Culture       Value: Multiple bacterial morphotypes present, none predominant. Suggest appropriate recollection if clinically indicated.     Performed at [facility]   Report Status 05/03/2013 FINAL    URINALYSIS, ROUTINE W REFLEX MICROSCOPIC      Result Value Range   Color, Urine AMBER (*) YELLOW   APPearance TURBID (*) CLEAR   Specific Gravity, Urine 1.028  1.005 - 1.030   pH 6.5  5.0 - 8.0   Glucose, UA NEGATIVE  NEGATIVE mg/dL   Hgb urine dipstick LARGE (*) NEGATIVE   Bilirubin Urine NEGATIVE  NEGATIVE   Ketones, ur NEGATIVE  NEGATIVE mg/dL   Protein, ur 161 (*) NEGATIVE mg/dL   Urobilinogen, UA 1.0  0.0 - 1.0 mg/dL   Nitrite NEGATIVE  NEGATIVE   Leukocytes, UA SMALL (*) NEGATIVE  PREGNANCY, URINE      Result Value Range   Preg Test, Ur NEGATIVE  NEGATIVE  URINE MICROSCOPIC-ADD  ON      Result Value Range   Squamous Epithelial / LPF MANY (*) RARE   WBC, UA 3-6  <3 WBC/hpf   RBC / HPF 7-10  <3 RBC/hpf   Bacteria, UA RARE  RARE   Urine-Other MUCOUS PRESENT       DG Lumbar Spine Complete (Final result)  Result time: 05/01/13 18:32:37    Final result by Rad Results In Interface (05/01/13 18:32:37)    Narrative:   CLINICAL DATA: Low back pain without injury.  EXAM: LUMBAR SPINE - COMPLETE 4+ VIEW  COMPARISON: None.  FINDINGS: There is no evidence of lumbar spine fracture. Alignment is normal. Intervertebral disc spaces are maintained.  IMPRESSION: Negative.   Electronically Signed By: Roque Lias M.D. On: 05/01/2013 18:32     MDM   1. Mass of breast, right   2. Back pain, chronic     Tracy Harrington is a 32 y.o. female with a PMH of benign cerebral tumor s/p VP shunt who presents to the ED for evaluation of cyst and back pain.  X-rays of the lumbar spine, urinalysis, and urine pregnancy ordered to further evaluate.   Rechecks  7:15 PM = patient anxious to be discharged. Declined pain medications and states she "just wants to go home." Patient sitting talking with family in no acute distress   Etiology of breast mass is possibly due to an abscess vs blocked cystic duct vs tumor?.  Patient was prescribed Keflex for possible infectious etiology. She was instructed to followup with a breast specialist and likely will need a mammogram for further evaluation. Patient was instructed to apply warm compresses as needed for symptomatic relief. She is instructed to return to the emergency room if she develops a fever, spreading redness or swelling, or worsening pain.  Etiology of back pain is likely chronic in nature. Her x-rays were negative for fracture or dislocation. Her urine was not suggestive of a urinary tract infection at this time. Urine sent for culture. Etiology of hematuria likely due to to menstrual period. She was neurovascularly intact no  weakness or difficulty with ambulation. Patient remained in no acute distress throughout her ED visit and declined pain medications. She was prescribed Naprosyn and Flexeril for outpatient management of her pain.  Patient was instructed to return to the ED if they experience any loss of  bowel or bladder function, weakness, loss of sensation, or other concerns.  Patient was in agreement with discharge and plan.     Final impressions: 1. Mass of right breast  2. Back pain, lower, chronic     Luiz Iron PA-C   This patient was discussed with Dr. Vickey Sages, PA-C 05/03/13 2242

## 2013-05-01 NOTE — ED Notes (Addendum)
Pt from home c/o R breast cyst that she noticed x1 week ago. Pt states that it "keeps getting bigger and bigger." Pt has yellowish/greenish discharge from nipple. Pt adds that she has chronic back pain, with numbness in her toes and upper buttocks. Pt is A&O and in NAD

## 2013-05-01 NOTE — ED Notes (Signed)
Pt refusing discharge vitals.

## 2013-05-03 LAB — URINE CULTURE

## 2013-05-05 NOTE — ED Provider Notes (Signed)
Medical screening examination/treatment/procedure(s) were performed by non-physician practitioner and as supervising physician I was immediately available for consultation/collaboration.  Monya Kozakiewicz R. Quaniya Damas, MD 05/05/13 1411 

## 2016-07-10 ENCOUNTER — Encounter (HOSPITAL_COMMUNITY): Payer: Self-pay | Admitting: Emergency Medicine

## 2016-07-10 ENCOUNTER — Emergency Department (HOSPITAL_COMMUNITY)
Admission: EM | Admit: 2016-07-10 | Discharge: 2016-07-10 | Disposition: A | Discharge status: Home / Self Care | Payer: Medicaid Other | Attending: Emergency Medicine | Admitting: Emergency Medicine

## 2016-07-10 DIAGNOSIS — N611 Abscess of the breast and nipple: Secondary | ICD-10-CM | POA: Insufficient documentation

## 2016-07-10 DIAGNOSIS — L0291 Cutaneous abscess, unspecified: Secondary | ICD-10-CM

## 2016-07-10 DIAGNOSIS — F17200 Nicotine dependence, unspecified, uncomplicated: Secondary | ICD-10-CM | POA: Insufficient documentation

## 2016-07-10 MED ORDER — LIDOCAINE-EPINEPHRINE-TETRACAINE (LET) SOLUTION
3.0000 mL | Freq: Once | NASAL | Status: AC
  Administered 2016-07-10: 3 mL via TOPICAL
  Filled 2016-07-10: qty 3

## 2016-07-10 MED ORDER — SULFAMETHOXAZOLE-TRIMETHOPRIM 800-160 MG PO TABS
1.0000 | ORAL_TABLET | Freq: Once | ORAL | Status: AC
  Administered 2016-07-10: 1 via ORAL
  Filled 2016-07-10: qty 1

## 2016-07-10 MED ORDER — SULFAMETHOXAZOLE-TRIMETHOPRIM 800-160 MG PO TABS: 1.0000 | ORAL_TABLET | Freq: Two times a day (BID) | ORAL | 0 refills | Status: AC

## 2016-07-10 MED ORDER — HYDROCODONE-ACETAMINOPHEN 5-325 MG PO TABS: 1.0000 | ORAL_TABLET | Freq: Four times a day (QID) | ORAL | 0 refills | Status: DC | PRN

## 2016-07-10 MED ORDER — NAPROXEN 500 MG PO TABS: 500.0000 mg | ORAL_TABLET | Freq: Two times a day (BID) | ORAL | 0 refills | Status: DC

## 2016-07-10 MED ORDER — TETANUS-DIPHTH-ACELL PERTUSSIS 5-2.5-18.5 LF-MCG/0.5 IM SUSP
0.5000 mL | Freq: Once | INTRAMUSCULAR | Status: AC
  Administered 2016-07-10: 0.5 mL via INTRAMUSCULAR
  Filled 2016-07-10: qty 0.5

## 2016-07-10 NOTE — ED Provider Notes (Signed)
[facility] DEPT Provider Note   CSN: [id number] Arrival date & time: 07/10/16  1116  By signing my name below, I, Rayna Sexton, attest that this documentation has been prepared under the direction and in the presence of Daquarius Dubeau C. Tehya Leath, PA-C. Electronically Signed: Rayna Sexton, ED Scribe. 07/10/16. 11:32 AM.   History   Chief Complaint Chief Complaint  Patient presents with  . Abscess   HPI HPI Comments: Tracy Harrington is a 35 y.o. female who presents to the Emergency Department complaining of areas of tenderness to her bilateral breasts. She states the area of tenderness on her right breast has been recurrent for the past year and worsened again beginning a few days ago. The area on the left breast was first noticed a few days ago. Both of the areas of tenderness are located around the nipple. She endorses occasional right-sided nipple discharge, firmness around the nipple, and changes in the shape of the nipple. She is not pregnant and denies currently breastfeeding. She is unsure of her tetanus status. Patient denies fever/chills, shortness of breath, rashes, or any other complaints.    The history is provided by the patient and medical records. No language interpreter was used.  Abscess  Abscess location: bilateral nipples. Abscess quality: draining, fluctuance and redness   Red streaking: no   Progression:  Worsening Chronicity:  Recurrent Context: not diabetes   Relieved by:  Draining/squeezing Worsened by:  Nothing Ineffective treatments:  None tried Associated symptoms: no fever   Risk factors: prior abscess    Past Medical History:  Diagnosis Date  . Benign cerebral tumor ([place])     There are no active problems to display for this patient.   Past Surgical History:  Procedure Laterality Date  . VENTRICULOPERITONEAL SHUNT      OB History    No data available       Home Medications    Prior to Admission medications   Medication Sig Start Date End  Date Taking? Authorizing Provider  cyclobenzaprine (FLEXERIL) 5 MG tablet Take 1 tablet (5 mg total) by mouth 3 (three) times daily as needed for muscle spasms. 05/01/13   Lucila Maine, PA-C  HYDROcodone-acetaminophen (NORCO/VICODIN) 5-325 MG tablet Take 1 tablet by mouth every 6 (six) hours as needed. 07/10/16   Lorayne Bender, PA-C  levonorgestrel (MIRENA) 20 MCG/24HR IUD 1 each by Intrauterine route once.    Historical Provider, MD  naproxen (NAPROSYN) 500 MG tablet Take 1 tablet (500 mg total) by mouth 2 (two) times daily with a meal. 05/01/13   Lucila Maine, PA-C  naproxen (NAPROSYN) 500 MG tablet Take 1 tablet (500 mg total) by mouth 2 (two) times daily. 07/10/16   Tyianna Menefee C Herlinda Heady, PA-C  sulfamethoxazole-trimethoprim (BACTRIM DS,SEPTRA DS) 800-160 MG tablet Take 1 tablet by mouth 2 (two) times daily. 07/10/16 07/17/16  Lorayne Bender, PA-C    Family History No family history on file.  Social History Social History  Substance Use Topics  . Smoking status: Current Every Day Smoker  . Smokeless tobacco: Not on file  . Alcohol use Yes     Allergies   Patient has no known allergies.   Review of Systems Review of Systems  Constitutional: Negative for chills and fever.  Respiratory: Negative for shortness of breath.   Musculoskeletal: Positive for myalgias.  Skin: Positive for color change and rash.       Bilateral breast pain  All other systems reviewed and are negative.  Physical Exam Updated  Vital Signs BP 124/71 (BP Location: Left Arm)   Pulse 75   Temp 98.1 F (36.7 C) (Oral)   Resp 18   Ht 5\' 5"  (1.651 m)   Wt (!) 140.6 kg   SpO2 100%   BMI 51.59 kg/m   Physical Exam  Constitutional: She appears well-developed and well-nourished. No distress.  HENT:  Head: Normocephalic and atraumatic.  Eyes: Conjunctivae are normal.  Neck: Neck supple.  Cardiovascular: Normal rate, regular rhythm, normal heart sounds and intact distal pulses.   Pulmonary/Chest: Effort normal  and breath sounds normal. No respiratory distress.  Abdominal: Soft. There is no tenderness. There is no guarding.  Musculoskeletal: She exhibits no edema.  Lymphadenopathy:    She has no cervical adenopathy.  Neurological: She is alert.  Skin: Skin is warm and dry. She is not diaphoretic.  Nursing chaperone present. Area of firmness without tenderness around the right nipple in the areolar region. Some nipple inversion noted. 1 cm x 0.5 cm area of fluctuance with surrounding erythema and tenderness at the left nipple. No nipple discharge noted.   Psychiatric: She has a normal mood and affect. Her behavior is normal.  Nursing note and vitals reviewed.  ED Treatments / Results  Labs (all labs ordered are listed, but only abnormal results are displayed) Labs Reviewed - No data to display  EKG  EKG Interpretation None       Radiology No results found.  Procedures .Marland KitchenIncision and Drainage Date/Time: 07/10/2016 11:52 AM Performed by: Lorayne Bender Authorized by: Arlean Hopping C   Consent:    Consent obtained:  Verbal   Consent given by:  Patient   Risks discussed:  Pain   Alternatives discussed:  No treatment Location:    Type:  Abscess   Size:  1 cm x 0.5 cm   Location:  Trunk   Trunk location:  L breast Pre-procedure details:    Procedure prep: alcohol. Anesthesia (see MAR for exact dosages):    Anesthesia method:  Topical application   Topical anesthetic:  LET Procedure type:    Complexity:  Simple Procedure details:    Needle aspiration: yes     Needle size:  18 G   Drainage:  Purulent   Drainage amount:  Scant   Wound treatment:  Wound left open   Packing materials:  None Post-procedure details:    Patient tolerance of procedure:  Tolerated well, no immediate complications     COORDINATION OF CARE: 11:31 AM Discussed next steps with pt. Pt verbalized understanding and is agreeable with the plan.    Medications Ordered in ED Medications    lidocaine-EPINEPHrine-tetracaine (LET) solution (3 mLs Topical Given 07/10/16 1144)  Tdap (BOOSTRIX) injection 0.5 mL (0.5 mLs Intramuscular Given 07/10/16 1141)  sulfamethoxazole-trimethoprim (BACTRIM DS,SEPTRA DS) 800-160 MG per tablet 1 tablet (1 tablet Oral Given 07/10/16 1225)    Initial Impression / Assessment and Plan / ED Course  I have reviewed the triage vital signs and the nursing notes.  Pertinent labs & imaging results that were available during my care of the patient were reviewed by me and considered in my medical decision making (see chart for details).  Clinical Course    Patient presents with bilateral breast pain. The area around the left nipple is clearly an abscess and benefited from I&D at the bedside. However, the area around the right nipple has associated breast changes that are suspicious and will require further investigation. Spoke with the [facility]. They state  that the patient will need to be referred for imaging by a PCP so that she has appropriate follow-up for the results. This information was passed on the patient. Case management was utilized to give the patient PCP resources. The importance of follow-up and subsequent imaging was stressed to the patient. Patient voiced understanding of these instructions, agreed to the plan of care, and is comfortable with discharge.   I personally performed the services described in this documentation, which was scribed in my presence. The recorded information has been reviewed and is accurate. Final Clinical Impressions(s) / ED Diagnoses   Final diagnoses:  Abscess    New Prescriptions Discharge Medication List as of 07/10/2016  1:14 PM    START taking these medications   Details  HYDROcodone-acetaminophen (NORCO/VICODIN) 5-325 MG tablet Take 1 tablet by mouth every 6 (six) hours as needed., Starting Thu 07/10/2016, Print    !! naproxen (NAPROSYN) 500 MG tablet Take 1 tablet (500 mg total) by  mouth 2 (two) times daily., Starting Thu 07/10/2016, Print    sulfamethoxazole-trimethoprim (BACTRIM DS,SEPTRA DS) 800-160 MG tablet Take 1 tablet by mouth 2 (two) times daily., Starting Thu 07/10/2016, Until Thu 07/17/2016, Print     !! - Potential duplicate medications found. Please discuss with provider.       Lorayne Bender, PA-C 07/11/16 [place], MD 07/11/16 2155

## 2016-07-10 NOTE — Discharge Instructions (Signed)
Please take all of your antibiotics until finished!   You may develop abdominal discomfort or diarrhea from the antibiotic.  You may help offset this with probiotics which you can buy or get in yogurt. Do not eat or take the probiotics until 2 hours after your antibiotic.   Apply warm compresses to the area. It is very important that you follow up with a primary care provider on this matter. You will need to have a primary care provider to schedule a breast ultrasound and follow up with you on the results. This is very important because you have some suspicious changes in your breasts that need further evaluation.  Naproxen or ibuprofen for pain. Vicodin for severe pain. Do not take the Vicodin while driving or performing other dangerous activities.

## 2016-07-10 NOTE — Progress Notes (Signed)
ED CM consult for Assistance with securing a PCP. Patient will need to get breast imaging due to suspicious breast changes. ED CM reviewed pt chart to find she was treated for breast mass 04/2013 at a [facility] facility Cm confirmed pt previously had medicaid and now is uninsured per EPIC Unable to find resource for uninsured mammogram evaluation Cm spoke with pt in Triage room CM spoke with pt who confirms uninsured [employer] resident with no pcp.  CM discussed and provided written information to assist pt with determining choice for uninsured accepting pcps, discussed the importance of pcp vs EDP services for f/u care, www.needymeds.org, www.goodrx.com, discounted pharmacies and other [employer] such as [employer] , [employer], affordable care act, financial assistance, uninsured dental services, [place] med assist, DSS and  health department  Reviewed resources for [employer] uninsured accepting pcps like [facility], family medicine at [address], [facility], [facility], local urgent care centers, [facility], [facility] family practice, general medical clinics, family services of the [place], [facility] urgent care plus others, medication resources, [facility] out patient pharmacies and housing Pt voiced understanding and appreciation of resources provided   Provided [facility] contact information Pt agreed to a referral Cm completed referral Pt to be contact by [facility] clinical liaison Discussed with pt that Cm nor [facility] can guarantee a mammogram evaluation for her There are certain criteria for women's clinic uninsured program Pt is age 35 General age 57-40 Pt is aware of the and states she has been informed by [facility] of this when she had medicaid Pt states she is not aware of family hx of breast cancer or cysts but states hers is recurrent Cm discussed importance getting examined by an uninsured pcp and/or OB GYN  Cm and pt called [facility] to get pt an appt for  Tuesday 07/29/16 at 0930 to establish care Encouraged pt to go to [facility] site to review services, and items needed to complete application, use of health department, encouraged her to call women's clinic again (since was is now uninsured and was with medicaid the last time she called. 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 States she received a letter from [employer] within the last "three to four months" that my medicaid is not active "Had my medicaid because of my daughter" Stated daughter 56 years old States DSS "asked me to put my baby daddy in for child support" and pt states she refused Pt also will see if Ray could see her again since she states she had been seen previously for an IUD she presently still has and states she needs it out and a new one replaced Cm encouraged her to speak with the billing office for arrangements  Teach back method used and pt states 1) [facility] to establish care 2) check if exceptions for affordable care act 3) do [id number] application and gather items needed to complete application 4) call other possible providers like [facility], [facility], health dept 5) use of good rx for medications 6) Continue to review other uninsured resource in the package offered to her by ED CM

## 2016-07-10 NOTE — ED Triage Notes (Signed)
Per patient abscess on left nipple-history of the same

## 2016-07-29 ENCOUNTER — Ambulatory Visit: Payer: Medicaid Other | Admitting: Family Medicine

## 2016-11-15 ENCOUNTER — Encounter (HOSPITAL_COMMUNITY): Payer: Self-pay

## 2016-11-15 ENCOUNTER — Emergency Department (HOSPITAL_COMMUNITY)
Admission: EM | Admit: 2016-11-15 | Discharge: 2016-11-15 | Disposition: A | Discharge status: Home / Self Care | Payer: Medicaid Other | Attending: Emergency Medicine | Admitting: Emergency Medicine

## 2016-11-15 DIAGNOSIS — F17200 Nicotine dependence, unspecified, uncomplicated: Secondary | ICD-10-CM | POA: Insufficient documentation

## 2016-11-15 DIAGNOSIS — Z79899 Other long term (current) drug therapy: Secondary | ICD-10-CM | POA: Insufficient documentation

## 2016-11-15 DIAGNOSIS — Z86011 Personal history of benign neoplasm of the brain: Secondary | ICD-10-CM | POA: Insufficient documentation

## 2016-11-15 DIAGNOSIS — J029 Acute pharyngitis, unspecified: Secondary | ICD-10-CM

## 2016-11-15 LAB — RAPID STREP SCREEN (MED CTR MEBANE ONLY): Streptococcus, Group A Screen (Direct): NEGATIVE

## 2016-11-15 MED ORDER — ACETAMINOPHEN 500 MG PO TABS
1000.0000 mg | ORAL_TABLET | Freq: Once | ORAL | Status: AC
  Administered 2016-11-15: 1000 mg via ORAL
  Filled 2016-11-15: qty 2

## 2016-11-15 NOTE — ED Triage Notes (Signed)
She c/o sore throat x 2 days. She also states she has hx of multiple dx of tonsillitis. She is in no distress.

## 2016-11-15 NOTE — ED Provider Notes (Signed)
[facility] DEPT Provider Note   CSN: [id number] Arrival date & time: 11/15/16  1028  By signing my name below, I, Sonum Patel, attest that this documentation has been prepared under the direction and in the presence of Providence Lanius, PA-C. Electronically Signed: Sonum Patel, Education administrator. 11/15/16. 11:13 AM.  History   Chief Complaint Chief Complaint  Patient presents with  . Sore Throat    The history is provided by the patient. No language interpreter was used.     HPI Comments: Tracy Harrington is a 36 y.o. female who presents to the Emergency Department complaining of a gradual onset, constant, gradually worsened sore throat for the past 2-3 days. She reports associated congestion and cough occasionally productive of green sputum. She also notes a mild HA but has not taken any medications for this. She states the pain is aggravated by swallowing. She has not tried any medications of home remedies for her symptoms. She denies difficulty swallowing or drooling, fever, abdominal pain, nausea, vomiting, SOB, difficulty with speech. She reports sick contacts at home. She reports being seen by an ENT who advised she have a tonsillectomy but patient did not follow up through with this.   Past Medical History:  Diagnosis Date  . 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   Past Surgical History:  Procedure Laterality Date  . VENTRICULOPERITONEAL SHUNT      OB History    No data available       Home Medications    Prior to Admission medications   Medication Sig Start Date End Date Taking? Authorizing Provider  cyclobenzaprine (FLEXERIL) 5 MG tablet Take 1 tablet (5 mg total) by mouth 3 (three) times daily as needed for muscle spasms. 05/01/13   Lucila Maine, PA-C  HYDROcodone-acetaminophen (NORCO/VICODIN) 5-325 MG tablet Take 1 tablet by mouth every 6 (six) hours as needed. 07/10/16   Lorayne Bender, PA-C  levonorgestrel (MIRENA)  20 MCG/24HR IUD 1 each by Intrauterine route once.    Historical Provider, MD  naproxen (NAPROSYN) 500 MG tablet Take 1 tablet (500 mg total) by mouth 2 (two) times daily with a meal. 05/01/13   Lucila Maine, PA-C  naproxen (NAPROSYN) 500 MG tablet Take 1 tablet (500 mg total) by mouth 2 (two) times daily. 07/10/16   Lorayne Bender, PA-C    Family History No family history on file.  Social History Social History  Substance Use Topics  . Smoking status: Current Every Day Smoker  . Smokeless tobacco: Not on file  . Alcohol use Yes     Allergies   Patient has no known allergies.   Review of Systems Review of Systems  Constitutional: Negative for fever.  HENT: Positive for congestion and sore throat. Negative for sinus pain, sinus pressure and trouble swallowing.   Respiratory: Positive for cough. Negative for shortness of breath.   Cardiovascular: Negative for chest pain.  Gastrointestinal: Negative for abdominal pain, constipation, diarrhea, nausea and vomiting.  Genitourinary: Negative for dysuria and hematuria.  Neurological: Positive for headaches.  All other systems reviewed and are negative.    Physical Exam Updated Vital Signs BP 118/87 (BP Location: Left Arm)   Pulse 100   Temp 97.9 F (36.6 C) (Oral)   Resp 16   Ht 5\' 4"  (1.626 m)   Wt (!) 141.1 kg   LMP 10/21/2016 (Approximate)   SpO2 97%   BMI 53.38 kg/m   Physical  Exam  Constitutional: She appears well-developed and well-nourished.  HENT:  Head: Normocephalic and atraumatic.  Mouth/Throat: Uvula is midline and mucous membranes are normal. Posterior oropharyngeal erythema present. Tonsillar exudate.  Tonsillar exudates and slight tonsillar edema noted. Uvula is midline. No "hot potato" voice.   Eyes: Conjunctivae and EOM are normal. Right eye exhibits no discharge. Left eye exhibits no discharge. No scleral icterus.  Neck: Full passive range of motion without pain. Neck supple.  Pulmonary/Chest: Effort  normal. No accessory muscle usage. No respiratory distress.  No respiratory distress, able to speak full sentences without difficulty.   Musculoskeletal: She exhibits no deformity.  Lymphadenopathy:    She has no cervical adenopathy.  Neurological: She is alert.  Skin: Skin is warm and dry.  Psychiatric: She has a normal mood and affect. Her speech is normal and behavior is normal.  Nursing note and vitals reviewed.    ED Treatments / Results  DIAGNOSTIC STUDIES: Oxygen Saturation is 97% on RA, normal by my interpretation.    COORDINATION OF CARE: 11:03 AM Discussed treatment plan with pt at bedside and pt agreed to plan.   Labs (all labs ordered are listed, but only abnormal results are displayed) Labs Reviewed  RAPID STREP SCREEN (NOT AT [facility])  CULTURE, GROUP A STREP [facility])    EKG  EKG Interpretation None       Radiology No results found.  Procedures Procedures (including critical care time)  Medications Ordered in ED Medications  acetaminophen (TYLENOL) tablet 1,000 mg (1,000 mg Oral Given 11/15/16 1204)     Initial Impression / Assessment and Plan / ED Course  I have reviewed the triage vital signs and the nursing notes.  Pertinent labs & imaging results that were available during my care of the patient were reviewed by me and considered in my medical decision making (see chart for details).    36 year old who presents with 2 days of worsening sore throat, cough, congestion. No fevers at home. Has a history of tonsillar edema supposed of had a tonsillectomy but has has not followed up with ENT. Physical exam with slight tonsillar edema and exudates. Cervical lymphadenopathy. Uvula is midline and she has no voiced deviation. Airway is patent and she is in no respiratory distress. Presentation not concerning for peritonsillar abscess or spread of infection to deep spaces of throat given history/physical exam. Consider pharyngitis versus upper respiratory infection.  Rapid strep done at bedside. Analgesics given in the department.  Pt with negative strep. Diagnosis of viral pharyngitis. No abx indicated at this time based on low risk score on Centor criteria. Throat culture sent. Discussed that results of strep culture are pending and patient will be informed if positive result and abx will be called in at that time. Discharge with symptomatic tx. No evidence of dehydration. Pt is tolerating secretions. Stable for discharge at this time. Instructed patient to follow-up with PCP in 2 days. Return precautions discussed. Patient expresses understanding and agreement to plan.    Final Clinical Impressions(s) / ED Diagnoses   Final diagnoses:  Viral pharyngitis    New Prescriptions Discharge Medication List as of 11/15/2016 11:43 AM     I personally performed the services described in this documentation, which was scribed in my presence. The recorded information has been reviewed and is accurate.    Volanda Napoleon, PA-C 11/15/16 [place], MD 11/16/16 [phone]

## 2016-11-15 NOTE — ED Notes (Signed)
Bed: WTR6 Expected date:  Expected time:  Means of arrival:  Comments: 

## 2016-11-15 NOTE — Discharge Instructions (Signed)
You can take Tylenol or ibuprofen as directed for pain.  Make sure you are drinking plenty of water and stay hydrated.  Follow-up clinic listed below in the next 24-48 hours.  Return the emergency department for any worsening pain, fever, difficulty breathing, difficulty talking or eating or any other worsening or concerning symptoms.   If you do not have a primary care doctor you see regularly, please you the list below. Please call them to arrange for follow-up.    No Primary Care Doctor [facility] Other agencies that provide inexpensive medical care    [facility]  [phone]    [facility]  [place]  [phone]    [facility]  [phone]    [facility]  [phone]    [facility]  [phone]

## 2016-11-15 NOTE — ED Notes (Signed)
PT DISCHARGED. INSTRUCTIONS GIVEN. AAOX4. PT IN NO APPARENT DISTRESS. THE OPPORTUNITY TO ASK QUESTIONS WAS PROVIDED. 

## 2016-11-18 LAB — CULTURE, GROUP A STREP (THRC)

## 2019-08-02 ENCOUNTER — Encounter (HOSPITAL_COMMUNITY): Payer: Self-pay

## 2019-08-02 ENCOUNTER — Other Ambulatory Visit: Payer: Self-pay

## 2019-08-02 ENCOUNTER — Ambulatory Visit (HOSPITAL_COMMUNITY)
Admission: EM | Admit: 2019-08-02 | Discharge: 2019-08-02 | Disposition: A | Discharge status: Home / Self Care | Payer: Self-pay | Attending: Urgent Care | Admitting: Urgent Care

## 2019-08-02 DIAGNOSIS — N644 Mastodynia: Secondary | ICD-10-CM

## 2019-08-02 DIAGNOSIS — N61 Mastitis without abscess: Secondary | ICD-10-CM

## 2019-08-02 DIAGNOSIS — N611 Abscess of the breast and nipple: Secondary | ICD-10-CM

## 2019-08-02 MED ORDER — DOXYCYCLINE HYCLATE 100 MG PO CAPS: 100.0000 mg | ORAL_CAPSULE | Freq: Two times a day (BID) | ORAL | 0 refills | Status: DC

## 2019-08-02 MED ORDER — NAPROXEN 500 MG PO TABS: 500.0000 mg | ORAL_TABLET | Freq: Two times a day (BID) | ORAL | 0 refills | Status: AC

## 2019-08-02 MED ORDER — LIDOCAINE-EPINEPHRINE (PF) 2 %-1:200000 IJ SOLN
INTRAMUSCULAR | Status: AC
  Filled 2019-08-02: qty 20

## 2019-08-02 NOTE — ED Triage Notes (Signed)
Pt presents to UC with abscess in left breast x 5 days.

## 2019-08-02 NOTE — ED Provider Notes (Signed)
  [facility]   MRN: [id number] DOB: Feb 26, 1981  Subjective:   Tracy Harrington is a 39 y.o. female presenting for 2 week history of acute onset worsening now severe constant sharp pain of her left breast about her nipple. Has a history of abscess of right breast that required lancing/I&D.  No current facility-administered medications for this encounter.  Current Outpatient Medications:  .  levonorgestrel (MIRENA) 20 MCG/24HR IUD, 1 each by Intrauterine route once., Disp: , Rfl:    No Known Allergies  Past Medical History:  Diagnosis Date  . Benign cerebral tumor [facility])      Past Surgical History:  Procedure Laterality Date  . VENTRICULOPERITONEAL SHUNT      Family History  Problem Relation Age of Onset  . Healthy Mother   . Healthy Father     Social History   Tobacco Use  . Smoking status: Current Every Day Smoker  . Smokeless tobacco: Never Used  Substance Use Topics  . Alcohol use: Yes  . Drug use: Yes    Types: Marijuana    ROS   Objective:   Vitals: BP 115/79 (BP Location: Right Arm)   Pulse 85   Temp 98.6 F (37 C) (Oral)   Resp 16   LMP  (Within Weeks) Comment: 2 weeks  SpO2 100%   Physical Exam Exam conducted with a chaperone present (RN Verdis Frederickson).  Constitutional:      General: She is not in acute distress.    Appearance: Normal appearance. She is well-developed. She is not ill-appearing, toxic-appearing or diaphoretic.  HENT:     Head: Normocephalic and atraumatic.     Nose: Nose normal.     Mouth/Throat:     Mouth: Mucous membranes are moist.  Eyes:     Extraocular Movements: Extraocular movements intact.     Pupils: Pupils are equal, round, and reactive to light.  Cardiovascular:     Rate and Rhythm: Normal rate and regular rhythm.     Pulses: Normal pulses.     Heart sounds: Normal heart sounds. No murmur. No friction rub. No gallop.   Pulmonary:     Effort: Pulmonary effort is normal. No respiratory distress.     Breath  sounds: Normal breath sounds. No stridor. No wheezing, rhonchi or rales.  Skin:    General: Skin is warm and dry.     Findings: No rash.  Neurological:     Mental Status: She is alert and oriented to person, place, and time.  Psychiatric:        Mood and Affect: Mood normal.        Behavior: Behavior normal.        Thought Content: Thought content normal.        Judgment: Judgment normal.    Attempted to inject numbing medication into left breast for I&D. Patient unable to tolerate this. Requested I stop immediately.  Assessment and Plan :   1. Mastitis, left, acute   2. Left breast abscess   3. Breast pain, left    Unfortunately, patient refused attempting I&D. Will use doxycycline, warm compresses. Advised naproxen for pain and inflammation. Counseled patient on potential for adverse effects with medications prescribed/recommended today, strict ER and return-to-clinic precautions discussed, patient verbalized understanding.    Jaynee Eagles, PA-C 08/02/19 1603

## 2019-08-02 NOTE — Discharge Instructions (Signed)
Use doxycycline and naproxen to help with your breast infection which I suspect is an abscess.  Unfortunately you refused incision and drainage, local anesthesia.  This means that you could get worse, if this happens then please report to the emergency room.

## 2019-08-05 ENCOUNTER — Telehealth (HOSPITAL_COMMUNITY): Payer: Self-pay | Admitting: Urgent Care

## 2019-08-05 MED ORDER — DOXYCYCLINE HYCLATE 100 MG PO CAPS: 100.0000 mg | ORAL_CAPSULE | Freq: Two times a day (BID) | ORAL | 0 refills | Status: AC

## 2019-08-05 NOTE — Telephone Encounter (Signed)
Needs med to go to [facility] due to cost. Also printed out goodrx coupon for patient that she will pick up

## 2020-04-03 ENCOUNTER — Other Ambulatory Visit: Payer: Medicaid Other

## 2021-05-13 ENCOUNTER — Ambulatory Visit (HOSPITAL_COMMUNITY)
Admission: EM | Admit: 2021-05-13 | Discharge: 2021-05-13 | Disposition: A | Discharge status: Home / Self Care | Payer: Medicaid Other | Attending: Emergency Medicine | Admitting: Emergency Medicine

## 2021-05-13 ENCOUNTER — Encounter (HOSPITAL_COMMUNITY): Payer: Self-pay | Admitting: Emergency Medicine

## 2021-05-13 ENCOUNTER — Other Ambulatory Visit: Payer: Self-pay

## 2021-05-13 DIAGNOSIS — R051 Acute cough: Secondary | ICD-10-CM | POA: Diagnosis not present

## 2021-05-13 DIAGNOSIS — Z20822 Contact with and (suspected) exposure to covid-19: Secondary | ICD-10-CM | POA: Insufficient documentation

## 2021-05-13 DIAGNOSIS — F17200 Nicotine dependence, unspecified, uncomplicated: Secondary | ICD-10-CM | POA: Diagnosis not present

## 2021-05-13 DIAGNOSIS — Z793 Long term (current) use of hormonal contraceptives: Secondary | ICD-10-CM | POA: Insufficient documentation

## 2021-05-13 DIAGNOSIS — Z7952 Long term (current) use of systemic steroids: Secondary | ICD-10-CM | POA: Diagnosis not present

## 2021-05-13 DIAGNOSIS — Z79899 Other long term (current) drug therapy: Secondary | ICD-10-CM | POA: Diagnosis not present

## 2021-05-13 DIAGNOSIS — F1290 Cannabis use, unspecified, uncomplicated: Secondary | ICD-10-CM | POA: Insufficient documentation

## 2021-05-13 DIAGNOSIS — R0602 Shortness of breath: Secondary | ICD-10-CM | POA: Insufficient documentation

## 2021-05-13 MED ORDER — PREDNISONE 20 MG PO TABS
40.0000 mg | ORAL_TABLET | Freq: Every day | ORAL | 0 refills | Status: AC
Start: 2021-05-13 — End: ?

## 2021-05-13 MED ORDER — ALBUTEROL SULFATE HFA 108 (90 BASE) MCG/ACT IN AERS: 2.0000 | INHALATION_SPRAY | RESPIRATORY_TRACT | 0 refills | Status: AC | PRN

## 2021-05-13 MED ORDER — BENZONATATE 100 MG PO CAPS: 100.0000 mg | ORAL_CAPSULE | Freq: Three times a day (TID) | ORAL | 0 refills | Status: AC

## 2021-05-13 NOTE — Discharge Instructions (Addendum)
Your lungs today are clear on exam therefore I have low suspicion of infections caused I do think your upper respiratory tract is irritated which is making her cough worse  Your COVID test is pending 24 hours, you will be notified if positive  Starting tomorrow take prednisone every morning with food for the next 5 days  Can use albuterol inhaler taking 2 puffs every 4 hours as needed for shortness of breath or wheezing  May use Tessalon pill every 8 hours as needed to help with cough  Maintaining adequate hydration may help to thin secretions and soothe the respiratory mucosa   Warm Liquids- Ingestion of warm liquids may have a soothing effect on the respiratory mucosa, increase the flow of nasal mucus, and loosen respiratory secretions, making them easier to remove  May try honey (2.5 to 5 mL [0.5 to 1 teaspoon]) can be given straight or diluted in liquid (juice). Corn syrup may be substituted if honey is not available.    May follow-up with urgent care as needed for persistent or worsening symptoms

## 2021-05-13 NOTE — ED Triage Notes (Signed)
Pt reports her daughter was sick with cold last week. Not patient having cough that is sometimes productive, chest pains that is tightness and worse with coughing. Little ear pain yesterday but none today.

## 2021-05-14 LAB — SARS CORONAVIRUS 2 (TAT 6-24 HRS): SARS Coronavirus 2: NEGATIVE

## 2021-05-14 NOTE — ED Provider Notes (Signed)
[facility]    CSN: [id number] Arrival date & time: 05/13/21  1739      History   Chief Complaint Chief Complaint  Patient presents with   Cough    HPI Tracy Harrington is a 40 y.o. female.   Patient presents with productive hacking cough and intermittent shortness of breath for 4 days.  Mild bilateral ear pain yesterday but has resolved.  Denies fever, chills, body aches, headaches, sore throat, chest pain or tightness, diarrhea.  Endorses that her child was sick with a cold last week.  Current every day smoker.  Has not attempted treatment for symptoms.  History of benign cerebral tumor.   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   Past Surgical History:  Procedure Laterality Date   VENTRICULOPERITONEAL SHUNT      OB History   No obstetric history on file.      Home Medications    Prior to Admission medications   Medication Sig Start Date End Date Taking? Authorizing Provider  albuterol (VENTOLIN HFA) 108 (90 Base) MCG/ACT inhaler Inhale 2 puffs into the lungs every 4 (four) hours as needed for wheezing or shortness of breath. 05/13/21  Yes Tremont Gavitt R, NP  benzonatate (TESSALON) 100 MG capsule Take 1 capsule (100 mg total) by mouth every 8 (eight) hours. 05/13/21  Yes Soriya Worster R, NP  predniSONE (DELTASONE) 20 MG tablet Take 2 tablets (40 mg total) by mouth daily. 05/13/21  Yes Tahjay Binion, Leitha Schuller, NP  doxycycline (VIBRAMYCIN) 100 MG capsule Take 1 capsule (100 mg total) by mouth 2 (two) times daily. 08/05/19   Jaynee Eagles, PA-C  levonorgestrel (MIRENA) 20 MCG/24HR IUD 1 each by Intrauterine route once.    [provider]  naproxen (NAPROSYN) 500 MG tablet Take 1 tablet (500 mg total) by mouth 2 (two) times daily. 08/02/19   Jaynee Eagles, PA-C    Family History Family History  Problem Relation Age of Onset   Healthy Mother    Healthy Father      Social History Social History   Tobacco Use   Smoking status: Every Day   Smokeless tobacco: Never  Substance Use Topics   Alcohol use: Yes   Drug use: Yes    Types: Marijuana     Allergies   Patient has no known allergies.   Review of Systems Review of Systems  Constitutional: Negative.   HENT: Negative.    Respiratory:  Positive for cough and shortness of breath. Negative for choking, chest tightness, wheezing and stridor.   Cardiovascular: Negative.   Gastrointestinal: Negative.   Skin: Negative.   Neurological: Negative.     Physical Exam Triage Vital Signs ED Triage Vitals  Enc Vitals Group     BP 05/13/21 1910 111/80     Pulse Rate 05/13/21 1910 87     Resp 05/13/21 1910 20     Temp 05/13/21 1910 98.9 F (37.2 C)     Temp Source 05/13/21 1910 Oral     SpO2 05/13/21 1910 96 %     Weight --      Height --      Head Circumference --      Peak Flow --      Pain Score 05/13/21 1908 7     Pain Loc --      Pain Edu? --      Excl. in  GC? --    No data found.  Updated Vital Signs BP 111/80 (BP Location: Right Arm)   Pulse 87   Temp 98.9 F (37.2 C) (Oral)   Resp 20   SpO2 96%   Visual Acuity Right Eye Distance:   Left Eye Distance:   Bilateral Distance:    Right Eye Near:   Left Eye Near:    Bilateral Near:     Physical Exam Constitutional:      Appearance: Normal appearance. She is normal weight.  HENT:     Head: Normocephalic.  Eyes:     Extraocular Movements: Extraocular movements intact.  Cardiovascular:     Rate and Rhythm: Normal rate and regular rhythm.     Pulses: Normal pulses.     Heart sounds: Normal heart sounds.  Pulmonary:     Effort: Pulmonary effort is normal.     Breath sounds: Normal breath sounds.  Skin:    General: Skin is warm and dry.  Neurological:     Mental Status: She is alert and oriented to person, place, and time. Mental status is at baseline.  Psychiatric:        Mood and Affect: Mood normal.         Behavior: Behavior normal.     UC Treatments / Results  Labs (all labs ordered are listed, but only abnormal results are displayed) Labs Reviewed  SARS CORONAVIRUS 2 (TAT 6-24 HRS)    EKG   Radiology No results found.  Procedures Procedures (including critical care time)  Medications Ordered in UC Medications - No data to display  Initial Impression / Assessment and Plan / UC Course  I have reviewed the triage vital signs and the nursing notes.  Pertinent labs & imaging results that were available during my care of the patient were reviewed by me and considered in my medical decision making (see chart for details).  Acute cough  Suspicion of infectious cause, lungs clear on exam, vital signs, etiology of symptoms possibly irritation due to cigarette usage, discussed with patient, will defer chest x-ray at this time and manage conservatively, patient in agreement with plan of care  1.  COVID test pending, required to return to work 2.  Prednisone 40 mg daily for 5 days 3.  Tessalon 100 mg 3 times daily as needed 4.  Albuterol Hailer 108 mcg 2 puffs 4 hours as needed 5.  Follow-up in urgent care as needed Final Clinical Impressions(s) / UC Diagnoses   Final diagnoses:  Acute cough     Discharge Instructions      Your lungs today are clear on exam therefore I have low suspicion of infections caused I do think your upper respiratory tract is irritated which is making her cough worse  Your COVID test is pending 24 hours, you will be notified if positive  Starting tomorrow take prednisone every morning with food for the next 5 days  Can use albuterol inhaler taking 2 puffs every 4 hours as needed for shortness of breath or wheezing  May use Tessalon pill every 8 hours as needed to help with cough  Maintaining adequate hydration may help to thin secretions and soothe the respiratory mucosa   Warm Liquids- Ingestion of warm liquids may have a soothing effect on the  respiratory mucosa, increase the flow of nasal mucus, and loosen respiratory secretions, making them easier to remove  May try honey (2.5 to 5 mL [0.5 to 1 teaspoon]) can be given straight or diluted  in liquid (juice). Corn syrup may be substituted if honey is not available.    May follow-up with urgent care as needed for persistent or worsening symptoms        ED Prescriptions     Medication Sig Dispense Auth. Provider   predniSONE (DELTASONE) 20 MG tablet Take 2 tablets (40 mg total) by mouth daily. 10 tablet Daisy Mcneel R, NP   benzonatate (TESSALON) 100 MG capsule Take 1 capsule (100 mg total) by mouth every 8 (eight) hours. 21 capsule Enijah Furr R, NP   albuterol (VENTOLIN HFA) 108 (90 Base) MCG/ACT inhaler Inhale 2 puffs into the lungs every 4 (four) hours as needed for wheezing or shortness of breath. 18 g Hans Eden, NP      PDMP not reviewed this encounter.   Hans Eden, [place] 05/14/21 [phone]

## 2023-09-29 ENCOUNTER — Ambulatory Visit (HOSPITAL_COMMUNITY)

## 2023-10-02 ENCOUNTER — Encounter (HOSPITAL_BASED_OUTPATIENT_CLINIC_OR_DEPARTMENT_OTHER): Payer: Medicaid Other | Admitting: Certified Nurse Midwife

## 2024-04-04 ENCOUNTER — Other Ambulatory Visit: Payer: Self-pay | Admitting: Certified Nurse Midwife

## 2024-04-04 DIAGNOSIS — Z1231 Encounter for screening mammogram for malignant neoplasm of breast: Secondary | ICD-10-CM

## 2024-04-05 ENCOUNTER — Encounter

## 2024-04-05 DIAGNOSIS — Z1231 Encounter for screening mammogram for malignant neoplasm of breast: Secondary | ICD-10-CM
